# Patient Record
Sex: Female | Born: 1980 | Race: Black or African American | Hispanic: No | Marital: Single | Smoking: Current every day smoker
Health system: Southern US, Community
[De-identification: ages and names within clinical notes are randomized; demographics above are authoritative.]

## PROBLEM LIST (undated history)

## (undated) ENCOUNTER — Inpatient Hospital Stay (HOSPITAL_COMMUNITY): Payer: Self-pay

## (undated) DIAGNOSIS — Z789 Other specified health status: Secondary | ICD-10-CM

## (undated) HISTORY — PX: NO PAST SURGERIES: SHX2092

---

## 2017-11-22 ENCOUNTER — Other Ambulatory Visit: Payer: Self-pay

## 2017-11-22 ENCOUNTER — Ambulatory Visit: Payer: Self-pay | Admitting: Physician Assistant

## 2017-11-22 ENCOUNTER — Encounter: Payer: Self-pay | Admitting: Physician Assistant

## 2017-11-22 VITALS — BP 120/78 | HR 101 | Temp 99.4°F | Resp 18 | Ht 66.0 in | Wt 170.0 lb

## 2017-11-22 DIAGNOSIS — F17210 Nicotine dependence, cigarettes, uncomplicated: Secondary | ICD-10-CM

## 2017-11-22 DIAGNOSIS — Z01818 Encounter for other preprocedural examination: Secondary | ICD-10-CM

## 2017-11-22 DIAGNOSIS — F17200 Nicotine dependence, unspecified, uncomplicated: Secondary | ICD-10-CM

## 2017-11-22 NOTE — Progress Notes (Signed)
Subjective:    Patient ID: Crystal Wiggins, female    DOB: January 04, 1981, 37 y.o.   MRN: [id number]  Chief Complaint  Patient presents with  . Establish Care    Pt states she is here to go over lab work she had done with [facility] through a doctor from [place]. Pt states she needs surgical clearance letter.   Patient presents today for medical clearance.  She is having an abdominoplasty on February 26 by Dr. Raynelle Chary near [place], [place].  She had an EKG performed at an outside facility and non-fasting blood work at [facility].   Recently moved from [place] with her child and fiance in 09/2017. Enjoys [place].  Currently not working due to the move, previously worked as Research scientist (physical sciences).   No significant medical history.  Currently taking Hair Skin and Nails vitamin and MSM. No prescription medication.  Overall, she feels well and healthy.  Working on quitting smoking. Has smoked once in the past 4 days.  Review of Systems  Constitutional: Negative for chills, fatigue, fever and unexpected weight change.  Eyes: Negative for visual disturbance.  Respiratory: Negative for shortness of breath and wheezing.   Cardiovascular: Negative for chest pain, palpitations and leg swelling.  Gastrointestinal: Negative for constipation, diarrhea, nausea and vomiting.  Genitourinary: Negative for difficulty urinating, frequency and urgency.  Musculoskeletal: Negative for neck stiffness.  Neurological: Negative for dizziness, weakness, light-headedness and headaches.   There are no active problems to display for this patient.  Prior to Admission medications   Medication Sig Start Date End Date Taking? Authorizing Provider  Methylsulfonylmethane (MSM) 1000 MG CAPS Take by mouth.   Yes [provider]  Multiple Vitamins-Minerals (HAIR SKIN AND NAILS FORMULA PO) Take by mouth.   Yes [provider]   No Known Allergies    Objective:   Physical Exam  Constitutional: She is  oriented to person, place, and time. She appears well-developed and well-nourished.  HENT:  Head: Normocephalic.  Eyes: Conjunctivae are normal.  Neck: No JVD present.  Cardiovascular: Normal rate, regular rhythm, normal heart sounds and intact distal pulses. Exam reveals no gallop and no friction rub.  No murmur heard. Pulses:      Radial pulses are 2+ on the right side, and 2+ on the left side.       Posterior tibial pulses are 2+ on the right side, and 2+ on the left side.  Pulmonary/Chest: Effort normal and breath sounds normal. No respiratory distress. She has no wheezes. She has no rales. She exhibits no tenderness.  Lymphadenopathy:    She has no cervical adenopathy.  Neurological: She is alert and oriented to person, place, and time.  Skin: Skin is warm and dry.  Psychiatric: She has a normal mood and affect. Her behavior is normal.   Blood pressure 120/78, pulse (!) 101, temperature 99.4 F (37.4 C), temperature source Oral, resp. rate 18, height 5\' 6"  (1.676 m), weight 170 lb (77.1 kg), last menstrual period 10/24/2017, SpO2 99 %.  EKG from outside facility on 11/18/2017 reviewed with Dr. Pamella Pert. NSR, rate 90 bpm. PR 170, QTc 420. No ischemia or LVH noted.  Outside labs drawn. Non-fasting. CBC, Chem 14, PT/INR/aPTT, HIV , A1C (5.6%), HCG, UCx normal except: MCHC 31.4 Glucose 109 Cl 107 CO2 19    Assessment & Plan:  1. Preoperative clearance EKG and lab results reviewed. Results and examination do not reveal any concerns for upcoming surgery. Provided letter to send to the plastic surgeon who  will perform her surgery later this month.  2. Cigarette smoking motivated to quit Continue efforts to quit smoking. Provided education information about the benefits of smoking cessation.   Return for re-evaluation as needed.  Noemi Chapel, PA-S

## 2017-11-22 NOTE — Progress Notes (Signed)
Patient ID: Crystal Wiggins, female     DOB: [birth year], 37 y.o.    MRN: [id number]  PCP: Patient, No Pcp Per  Chief Complaint  Patient presents with  . Establish Care    Pt states she is here to go over lab work she had done with [facility] through a doctor from [place]. Pt states she needs surgical clearance letter.    Subjective:   This patient is new to me and presents for evaluation of fitness for upcoming surgery. She is accompanied by Crystal 37 year old Wiggins.  Moved to [place] from [place] in 09/2018. Abdominoplasty is scheduled for 12/17/2017 with Dr. Raynelle Chary in [place], [place].  She is working on quitting smoking. Has smoked once in the past 4 days.  No other significant PMH/chronic problems. No previous surgeries.  Review of Systems Constitutional: Negative for chills, fatigue, fever and unexpected weight change.  Eyes: Negative for visual disturbance.  Respiratory: Negative for shortness of breath and wheezing.   Cardiovascular: Negative for chest pain, palpitations and leg swelling.  Gastrointestinal: Negative for constipation, diarrhea, nausea and vomiting.  Genitourinary: Negative for difficulty urinating, frequency and urgency.  Musculoskeletal: Negative for neck stiffness.  Neurological: Negative for dizziness, weakness, light-headedness and headaches.    Prior to Admission medications   Medication Sig Start Date End Date Taking? Authorizing Provider  Methylsulfonylmethane (MSM) 1000 MG CAPS Take by mouth.   Yes [provider]  Multiple Vitamins-Minerals (HAIR SKIN AND NAILS FORMULA PO) Take by mouth.   Yes [provider]     No Known Allergies   There are no active problems to display for this patient.    Family History  Problem Relation Age of Onset  . Heart disease Father   . Cancer Maternal Grandmother   . Diabetes Paternal Grandmother      Social History   Socioeconomic History  . Marital status: Significant Other   Spouse name: engaged  . Number of children: 1  . Years of education: Not on file  . Highest education level: Not on file  Social Needs  . Financial resource strain: Not on file  . Food insecurity - worry: Not on file  . Food insecurity - inability: Not on file  . Transportation needs - medical: Not on file  . Transportation needs - non-medical: Not on file  Occupational History  . 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 Smoking status: Current Some Day Smoker    Types: Cigarettes  . Smokeless tobacco: Current User  . Tobacco comment: currently trying to quit (decreasing amount)  Substance and Sexual Activity  . Alcohol use: No    Frequency: Never  . Drug use: No  . Sexual activity: Yes    Birth control/protection: None  Other Topics Concern  . Not on file  Social History Narrative   Moved from [place] with Crystal Wiggins (born 04/28/2012)and fiance 09/2017 to be near Crystal Wiggins.    Likes [place]       Objective:  Physical Exam  Constitutional: She is oriented to person, place, and time. She appears well-developed and well-nourished. She is active and cooperative. No distress.  BP 120/78 (BP Location: Right Arm, Patient Position: Sitting, Cuff Size: Normal)   Pulse (!) 101   Temp 99.4 F (37.4 C) (Oral)   Resp 18   Ht 5\' 6"  (1.676 m)   Wt 170 lb (77.1 kg)   LMP 10/24/2017   SpO2 99%  BMI 27.44 kg/m   HENT:  Head: Normocephalic and atraumatic.  Right Ear: Hearing normal.  Left Ear: Hearing normal.  Eyes: Conjunctivae are normal. No scleral icterus.  Neck: Normal range of motion. Neck supple. No thyromegaly present.  Cardiovascular: Normal rate, regular rhythm and normal heart sounds.  Pulses:      Radial pulses are 2+ on the right side, and 2+ on the left side.  Pulmonary/Chest: Effort normal and breath sounds normal.  Lymphadenopathy:       Head (right side): No tonsillar, no preauricular, no posterior auricular and no  occipital adenopathy present.       Head (left side): No tonsillar, no preauricular, no posterior auricular and no occipital adenopathy present.    She has no cervical adenopathy.       Right: No supraclavicular adenopathy present.       Left: No supraclavicular adenopathy present.  Neurological: She is alert and oriented to person, place, and time. No sensory deficit.  Skin: Skin is warm, dry and intact. No rash noted. No cyanosis or erythema. Nails show no clubbing.  Psychiatric: She has a normal mood and affect. Crystal speech is normal and behavior is normal.    EKG from outside facility on 11/18/2017 reviewed with Dr. Pamella Pert. NSR, rate 90 bpm. PR 170, QTc 420. No ischemia or LVH noted.  Outside labs drawn. Non-fasting. CBC, Chem 14, PT/INR/aPTT, HIV , A1C (5.6%), HCG, UCx normal except: MCHC 31.4 Glucose 109 Cl 107 CO2 19    Assessment & Plan:  1. Preoperative clearance Low risk for surgical procedure. Letter to Dr. Raynelle Chary.  2. Cigarette smoker motivated to quit Encouraged smoking cessation.    Return for re-evaluation as needed.   Fara Chute, PA-C [facility]

## 2017-11-22 NOTE — Patient Instructions (Addendum)
Your EKG and labs and examination do not reveal any concerns for your upcoming surgery.    IF you received an x-ray today, you will receive an invoice from [facility]. Please contact [facility] at [phone] with questions or concerns regarding your invoice.   IF you received labwork today, you will receive an invoice from [facility]. Please contact [facility] at [phone] with questions or concerns regarding your invoice.   Our billing staff will not be able to assist you with questions regarding bills from these companies.  You will be contacted with the lab results as soon as they are available. The fastest way to get your results is to activate your My Chart account. Instructions are located on the last page of this paperwork. If you have not heard from [place] regarding the results in 2 weeks, please contact this office.     Did you know that you begin to benefit from quitting smoking within the first twenty minutes? It's TRUE.  At 20 minutes: -blood pressure decreases -pulse rate drops -body temperature of hands and feet increases  At 8 hours: -carbon monoxide level in blood drops to normal -oxygen level in blood increases to normal  At 24 hours: -the chance of heart attack decreases  At 48 hours: -nerve endings start regrowing -ability to smell and taste is enhanced  2 weeks-3 months: -circulation improves -walking becomes easier -lung function improves  1-9 months: -coughing, sinus congestion, fatigue and shortness of breath decreases  1 year: -excess risk of heart disease is decreased to HALF that of a smoker  5 years: Stroke risk is reduced to that of people who have never smoked  10 years: -risk of lung cancer drops to as little as half that of continuing smokers -risk of cancer of the mouth, throat, esophagus, bladder, kidney and pancreas decreases -risk of ulcer decreases  15 years -risk of heart disease is now similar to that of people  who have never smoked -risk of death returns to nearly the level of people who have never smoked

## 2017-11-23 ENCOUNTER — Encounter: Payer: Self-pay | Admitting: Physician Assistant

## 2017-11-23 DIAGNOSIS — F17210 Nicotine dependence, cigarettes, uncomplicated: Secondary | ICD-10-CM | POA: Insufficient documentation

## 2017-11-23 DIAGNOSIS — F17200 Nicotine dependence, unspecified, uncomplicated: Secondary | ICD-10-CM

## 2018-07-05 ENCOUNTER — Encounter (HOSPITAL_COMMUNITY): Payer: Self-pay

## 2018-07-05 ENCOUNTER — Other Ambulatory Visit: Payer: Self-pay

## 2018-07-05 ENCOUNTER — Emergency Department (HOSPITAL_COMMUNITY)
Admission: EM | Admit: 2018-07-05 | Discharge: 2018-07-05 | Disposition: A | Discharge status: Home / Self Care | Payer: Medicaid Other | Attending: Emergency Medicine | Admitting: Emergency Medicine

## 2018-07-05 DIAGNOSIS — Y9389 Activity, other specified: Secondary | ICD-10-CM | POA: Insufficient documentation

## 2018-07-05 DIAGNOSIS — Y92009 Unspecified place in unspecified non-institutional (private) residence as the place of occurrence of the external cause: Secondary | ICD-10-CM | POA: Insufficient documentation

## 2018-07-05 DIAGNOSIS — S0990XA Unspecified injury of head, initial encounter: Secondary | ICD-10-CM | POA: Diagnosis not present

## 2018-07-05 DIAGNOSIS — Z3A12 12 weeks gestation of pregnancy: Secondary | ICD-10-CM | POA: Diagnosis not present

## 2018-07-05 DIAGNOSIS — F17210 Nicotine dependence, cigarettes, uncomplicated: Secondary | ICD-10-CM | POA: Insufficient documentation

## 2018-07-05 DIAGNOSIS — W228XXA Striking against or struck by other objects, initial encounter: Secondary | ICD-10-CM | POA: Insufficient documentation

## 2018-07-05 DIAGNOSIS — O99331 Smoking (tobacco) complicating pregnancy, first trimester: Secondary | ICD-10-CM | POA: Diagnosis not present

## 2018-07-05 DIAGNOSIS — Y999 Unspecified external cause status: Secondary | ICD-10-CM | POA: Insufficient documentation

## 2018-07-05 MED ORDER — ACETAMINOPHEN 325 MG PO TABS
650.0000 mg | ORAL_TABLET | Freq: Once | ORAL | Status: AC
  Administered 2018-07-05: 650 mg via ORAL
  Filled 2018-07-05: qty 2

## 2018-07-05 NOTE — ED Triage Notes (Signed)
Pt presents to ED via EMS after a can fell on her head. Pt reports that she has had a headache, and it's getting worse. No LOC. Pt is [redacted] weeks pregnant.

## 2018-07-05 NOTE — ED Provider Notes (Signed)
[facility] DEPT Provider Note   CSN: [id number] Arrival date & time: 07/05/18  0113     History   Chief Complaint Chief Complaint  Patient presents with  . Head Injury    HPI Crystal Wiggins is a 37 y.o. female.  Patint presents to ED after a minor head injury earlier in the evening. She opened a cabinet while at home and a large can of food fell from the cabinet hitting her in the right frontal scalp. No LOC. She reports nausea. The accident happened around 1;00 am. There is no wound. She denies neck pain. The patient reports she is [redacted] weeks pregnant. No abdominal injury. No vaginal bleeding or abdominal pain.  The history is provided by the patient. No language interpreter was used.  Head Injury   Pertinent negatives include no vomiting.    History reviewed. No pertinent past medical history.  Patient Active Problem List   Diagnosis Date Noted  . Cigarette smoker motivated to quit 11/23/2017    History reviewed. No pertinent surgical history.   OB History   None      Home Medications    Prior to Admission medications   Medication Sig Start Date End Date Taking? Authorizing Provider  prenatal vitamin w/FE, FA (PRENATAL 1 + 1) 27-1 MG TABS tablet Take 1 tablet by mouth daily at 12 noon.   Yes [provider]    Family History Family History  Problem Relation Age of Onset  . Heart disease Father   . Cancer Maternal Grandmother   . Diabetes Paternal Grandmother     Social History Social History   Tobacco Use  . Smoking status: Current Some Day Smoker    Types: Cigarettes  . Smokeless tobacco: Current User  . Tobacco comment: currently trying to quit (decreasing amount)  Substance Use Topics  . Alcohol use: No    Frequency: Never  . Drug use: No     Allergies   Patient has no known allergies.   Review of Systems Review of Systems  Eyes: Negative for visual disturbance.  Gastrointestinal: Positive for  nausea. Negative for vomiting.  Musculoskeletal: Negative for neck pain.  Neurological: Positive for headaches. Negative for syncope.     Physical Exam Updated Vital Signs BP 127/84 (BP Location: Right Arm)   Pulse 88   Temp 98.2 F (36.8 C) (Oral)   Resp 18   Ht 5\' 6"  (1.676 m)   Wt 80.3 kg   SpO2 98%   BMI 28.57 kg/m   Physical Exam  Constitutional: She is oriented to person, place, and time. She appears well-developed and well-nourished.  HENT:  Head: Normocephalic.  Scalp tenderness over right frontal area. No significant swelling, wound or bleeding.  Eyes: Conjunctivae are normal.  Neck: Normal range of motion. Neck supple.  Cardiovascular: Normal rate and regular rhythm.  Pulmonary/Chest: Effort normal and breath sounds normal. She has no wheezes. She has no rales. She exhibits no tenderness.  Abdominal: Soft. Bowel sounds are normal. There is no tenderness. There is no rebound and no guarding.  Musculoskeletal: Normal range of motion.  No midline or paracervical tenderness.   Neurological: She is alert and oriented to person, place, and time. No sensory deficit. She exhibits normal muscle tone. Coordination normal.  CN's 3-12 grossly intact. Speech is clear and focused. No facial asymmetry. No lateralizing weakness.  No deficits of coordination. Ambulatory without imbalance.    Skin: Skin is warm and dry. No rash noted.  Psychiatric: She has a normal mood and affect.     ED Treatments / Results  Labs (all labs ordered are listed, but only abnormal results are displayed) Labs Reviewed - No data to display  EKG None  Radiology No results found.  Procedures Procedures (including critical care time)  Medications Ordered in ED Medications - No data to display   Initial Impression / Assessment and Plan / ED Course  I have reviewed the triage vital signs and the nursing notes.  Pertinent labs & imaging results that were available during my care of the  patient were reviewed by me and considered in my medical decision making (see chart for details).     Patient here for evaluation of minor head injury without LOC. It has been 6 hours since the injury and she has a nonfocal neurologic exam. No new symptoms since injury. She is felt appropriate for discharge home without evidence for intracranial injury.  Final Clinical Impressions(s) / ED Diagnoses   Final diagnoses:  None   1. Minor head injury  ED Discharge Orders    None       Dennie Bible 07/05/18 Karn Pickler, April, MD 07/05/18 3143

## 2018-08-22 ENCOUNTER — Encounter (HOSPITAL_COMMUNITY): Payer: Self-pay | Admitting: *Deleted

## 2018-08-22 ENCOUNTER — Other Ambulatory Visit: Payer: Self-pay

## 2018-08-22 ENCOUNTER — Inpatient Hospital Stay (HOSPITAL_BASED_OUTPATIENT_CLINIC_OR_DEPARTMENT_OTHER): Payer: Medicaid Other

## 2018-08-22 ENCOUNTER — Inpatient Hospital Stay (HOSPITAL_COMMUNITY)
Admission: AD | Admit: 2018-08-22 | Discharge: 2018-08-22 | Disposition: A | Discharge status: Home / Self Care | Payer: Medicaid Other | Source: Ambulatory Visit | Attending: Obstetrics | Admitting: Obstetrics

## 2018-08-22 DIAGNOSIS — R1030 Lower abdominal pain, unspecified: Secondary | ICD-10-CM | POA: Insufficient documentation

## 2018-08-22 DIAGNOSIS — B9689 Other specified bacterial agents as the cause of diseases classified elsewhere: Secondary | ICD-10-CM | POA: Diagnosis not present

## 2018-08-22 DIAGNOSIS — O3412 Maternal care for benign tumor of corpus uteri, second trimester: Secondary | ICD-10-CM | POA: Diagnosis not present

## 2018-08-22 DIAGNOSIS — O3410 Maternal care for benign tumor of corpus uteri, unspecified trimester: Secondary | ICD-10-CM

## 2018-08-22 DIAGNOSIS — O23592 Infection of other part of genital tract in pregnancy, second trimester: Secondary | ICD-10-CM | POA: Diagnosis not present

## 2018-08-22 DIAGNOSIS — N760 Acute vaginitis: Secondary | ICD-10-CM | POA: Diagnosis not present

## 2018-08-22 DIAGNOSIS — Z3A19 19 weeks gestation of pregnancy: Secondary | ICD-10-CM | POA: Insufficient documentation

## 2018-08-22 DIAGNOSIS — R109 Unspecified abdominal pain: Secondary | ICD-10-CM

## 2018-08-22 DIAGNOSIS — D259 Leiomyoma of uterus, unspecified: Secondary | ICD-10-CM | POA: Insufficient documentation

## 2018-08-22 DIAGNOSIS — O26899 Other specified pregnancy related conditions, unspecified trimester: Secondary | ICD-10-CM

## 2018-08-22 DIAGNOSIS — O99332 Smoking (tobacco) complicating pregnancy, second trimester: Secondary | ICD-10-CM | POA: Insufficient documentation

## 2018-08-22 DIAGNOSIS — F17210 Nicotine dependence, cigarettes, uncomplicated: Secondary | ICD-10-CM | POA: Insufficient documentation

## 2018-08-22 DIAGNOSIS — O9989 Other specified diseases and conditions complicating pregnancy, childbirth and the puerperium: Secondary | ICD-10-CM | POA: Diagnosis not present

## 2018-08-22 DIAGNOSIS — O26892 Other specified pregnancy related conditions, second trimester: Secondary | ICD-10-CM | POA: Diagnosis not present

## 2018-08-22 HISTORY — DX: Other specified health status: Z78.9

## 2018-08-22 LAB — URINALYSIS, ROUTINE W REFLEX MICROSCOPIC
BILIRUBIN URINE: NEGATIVE
Glucose, UA: NEGATIVE mg/dL
HGB URINE DIPSTICK: NEGATIVE
Ketones, ur: 5 mg/dL — AB
NITRITE: NEGATIVE
PH: 6 (ref 5.0–8.0)
Protein, ur: NEGATIVE mg/dL
SPECIFIC GRAVITY, URINE: 1.025 (ref 1.005–1.030)

## 2018-08-22 LAB — WET PREP, GENITAL
SPERM: NONE SEEN
Trich, Wet Prep: NONE SEEN
Yeast Wet Prep HPF POC: NONE SEEN

## 2018-08-22 MED ORDER — METRONIDAZOLE 500 MG PO TABS: 500.0000 mg | ORAL_TABLET | Freq: Two times a day (BID) | ORAL | 0 refills | Status: DC

## 2018-08-22 NOTE — Progress Notes (Signed)
Vaginal cultures obtained by C. Maryruth Hancock, CNM

## 2018-08-22 NOTE — MAU Provider Note (Signed)
History     CSN: [id number]  Arrival date and time: 08/22/18 1352   First Provider Initiated Contact with Patient 08/22/18 1453      Chief Complaint  Patient presents with  . Abdominal Pain   HPI Crystal Wiggins is a 37 y.o. G4P2102 at [redacted]w[redacted]d who presents with lower abdominal cramping since 0930 this morning. She rates the pain a 6/10 and has not tried anything for the pain. She denies any leaking or bleeding. Reports feeling fetal movement. No recent intercourse. She has not had an ultrasound this pregnancy. Denies any complications in this pregnancy but reports a history of a 20 week still birth.  OB History    Gravida  4   Para  3   Term  2   Preterm  1   AB  0   Living  2     SAB  0   TAB  0   Ectopic  0   Multiple  0   Live Births  2           Past Medical History:  Diagnosis Date  . Medical history non-contributory     Past Surgical History:  Procedure Laterality Date  . NO PAST SURGERIES      Family History  Problem Relation Age of Onset  . Heart disease Father   . Cancer Maternal Grandmother   . Diabetes Paternal Grandmother     Social History   Tobacco Use  . Smoking status: Current Some Day Smoker    Types: Cigarettes  . Smokeless tobacco: Current User  . Tobacco comment: currently trying to quit (decreasing amount)  Substance Use Topics  . Alcohol use: No    Frequency: Never  . Drug use: No    Allergies: No Known Allergies  Medications Prior to Admission  Medication Sig Dispense Refill Last Dose  . prenatal vitamin w/FE, FA (PRENATAL 1 + 1) 27-1 MG TABS tablet Take 1 tablet by mouth daily at 12 noon.   07/04/2018 at Unknown time    Review of Systems  Constitutional: Negative.  Negative for fatigue and fever.  HENT: Negative.   Respiratory: Negative.  Negative for shortness of breath.   Cardiovascular: Negative.  Negative for chest pain.  Gastrointestinal: Positive for abdominal pain. Negative for constipation, diarrhea,  nausea and vomiting.  Genitourinary: Negative.  Negative for dysuria.  Neurological: Negative.  Negative for dizziness and headaches.   Physical Exam   Blood pressure 121/62, pulse 95, temperature 98.8 F (37.1 C), temperature source Oral, resp. rate 16, weight 85.4 kg, last menstrual period 10/24/2017, SpO2 100 %.  Physical Exam  Nursing note and vitals reviewed. Constitutional: She is oriented to person, place, and time. She appears well-developed and well-nourished. No distress.  HENT:  Head: Normocephalic.  Eyes: Pupils are equal, round, and reactive to light.  Cardiovascular: Normal rate, regular rhythm and normal heart sounds.  Respiratory: Effort normal and breath sounds normal. No respiratory distress.  GI: Soft. Bowel sounds are normal. She exhibits no distension and no mass. There is no tenderness. There is no rebound and no guarding.  Neurological: She is alert and oriented to person, place, and time.  Skin: Skin is warm and dry.  Psychiatric: She has a normal mood and affect. Her behavior is normal. Judgment and thought content normal.   Dilation: Closed Effacement (%): Thick Exam by:: Haynes Bast, CNM  FHT: 150 bpm  MAU Course  Procedures Results for orders placed or performed during the hospital encounter  of 08/22/18 (from the past 24 hour(s))  Urinalysis, Routine w reflex microscopic     Status: Abnormal   Collection Time: 08/22/18  2:26 PM  Result Value Ref Range   Color, Urine YELLOW YELLOW   APPearance CLOUDY (A) CLEAR   Specific Gravity, Urine 1.025 1.005 - 1.030   pH 6.0 5.0 - 8.0   Glucose, UA NEGATIVE NEGATIVE mg/dL   Hgb urine dipstick NEGATIVE NEGATIVE   Bilirubin Urine NEGATIVE NEGATIVE   Ketones, ur 5 (A) NEGATIVE mg/dL   Protein, ur NEGATIVE NEGATIVE mg/dL   Nitrite NEGATIVE NEGATIVE   Leukocytes, UA TRACE (A) NEGATIVE   RBC / HPF 0-5 0 - 5 RBC/hpf   WBC, UA 0-5 0 - 5 WBC/hpf   Bacteria, UA RARE (A) NONE SEEN   Squamous Epithelial / LPF 11-20 0  - 5   Mucus PRESENT   Wet prep, genital     Status: Abnormal   Collection Time: 08/22/18  4:17 PM  Result Value Ref Range   Yeast Wet Prep HPF POC NONE SEEN NONE SEEN   Trich, Wet Prep NONE SEEN NONE SEEN   Clue Cells Wet Prep HPF POC PRESENT (A) NONE SEEN   WBC, Wet Prep HPF POC FEW (A) NONE SEEN   Sperm NONE SEEN    MDM UA Wet prep and gc/chlamydia Patient declined pain medication in MAU [place] MFM OB Limited- cervical length 4.43 cm, normal AFI, posterior placenta, multiple uterine fibroids noted with the largest being 4cm  Assessment and Plan   1. Abdominal pain affecting pregnancy   2. Bacterial vaginosis   3. Uterine fibroid during pregnancy, antepartum   4. [redacted] weeks gestation of pregnancy    -Discharge home in stable condition -Rx for metronidazole sent to patient's pharmacy -Preterm labor precautions discussed -Patient advised to follow-up with [facility] as scheduled on 11/8 for prenatal care -Patient may return to MAU as needed or if her condition were to change or worsen  Wende Mott CNM 08/22/2018, 2:53 PM

## 2018-08-22 NOTE — MAU Note (Signed)
Having abd cramping and pressure.  Started at 0930 this morning.

## 2018-08-22 NOTE — Discharge Instructions (Signed)

## 2018-08-25 LAB — GC/CHLAMYDIA PROBE AMP (~~LOC~~) NOT AT ARMC
CHLAMYDIA, DNA PROBE: NEGATIVE
Neisseria Gonorrhea: NEGATIVE

## 2018-10-22 NOTE — L&D Delivery Note (Signed)
Delivery Note Patient pushed for < 10 minutes.  At 2:12 PM a viable female was delivered via Vaginal, Spontaneous.  Presentation: vertex; Position: Right,, Occiput,, Anterior;   During delivery of the fetal head, it rotated > 180 degrees to ROA.  After delivery of the fetal head, the shoulders did not easily deliver.  The patient was instructed to stop pushing.  McRoberts and suprapubic pressure were employed.  The posterior (right) arm was unable to be delivered.  The woods screw maneuver was then successful in rotating the anterior (left) shoulder such that the shoulder and body was able to be delivered.   The cord was cut and clamped and the baby handed off to the team for evaluation.  NICU was called at identification of the shoulder dystocia.  Following delivery, baby was moving both upper extremities equally and well.  Total time for dystocia was 90 sec.   Delivery of the head: 01/11/2019  2:11 PM First maneuver: 01/11/2019  2:11 PM, McRoberts Suprapubic Pressure Second maneuver: , Woods screw (fetal shoulder rotation)   APGAR: 3 , 9; weight pending .   Arterial cord gas pH 7.28.   The placenta delivered spontaneously and without difficulty.  However, after delivery, it was noted that most of the membranes had not delivered with the placenta.  A uterine sweep was performed and the remainder of membranes removed manually.  Will plan one dose of ancef.   Anesthesia:  Epidural Episiotomy:  None Lacerations:  None Suture Repair: n/a Est. Blood Loss (mL): 180 mL    Mom to postpartum.  Baby to Couplet care / Skin to Skin.  Crystal Wiggins 01/11/2019, 2:41 PM

## 2019-01-05 ENCOUNTER — Other Ambulatory Visit: Payer: Self-pay | Admitting: Obstetrics

## 2019-01-05 ENCOUNTER — Telehealth (HOSPITAL_COMMUNITY): Payer: Self-pay | Admitting: *Deleted

## 2019-01-09 ENCOUNTER — Other Ambulatory Visit (HOSPITAL_COMMUNITY): Payer: Self-pay | Admitting: *Deleted

## 2019-01-11 ENCOUNTER — Inpatient Hospital Stay (HOSPITAL_COMMUNITY): Payer: Medicaid Other | Admitting: Anesthesiology

## 2019-01-11 ENCOUNTER — Inpatient Hospital Stay (HOSPITAL_COMMUNITY)
Admission: EM | Admit: 2019-01-11 | Discharge: 2019-01-12 | DRG: 807 | Disposition: A | Discharge status: Home / Self Care | Payer: Medicaid Other | Attending: Obstetrics | Admitting: Obstetrics

## 2019-01-11 ENCOUNTER — Encounter (HOSPITAL_COMMUNITY): Payer: Self-pay

## 2019-01-11 ENCOUNTER — Other Ambulatory Visit: Payer: Self-pay

## 2019-01-11 ENCOUNTER — Inpatient Hospital Stay (HOSPITAL_COMMUNITY): Admission: AD | Admit: 2019-01-11 | Payer: Medicaid Other | Source: Home / Self Care | Admitting: Obstetrics

## 2019-01-11 ENCOUNTER — Inpatient Hospital Stay (HOSPITAL_COMMUNITY): Payer: Medicaid Other

## 2019-01-11 DIAGNOSIS — O09523 Supervision of elderly multigravida, third trimester: Secondary | ICD-10-CM | POA: Diagnosis present

## 2019-01-11 DIAGNOSIS — O26893 Other specified pregnancy related conditions, third trimester: Secondary | ICD-10-CM | POA: Diagnosis present

## 2019-01-11 DIAGNOSIS — O99824 Streptococcus B carrier state complicating childbirth: Principal | ICD-10-CM | POA: Diagnosis present

## 2019-01-11 DIAGNOSIS — Z87891 Personal history of nicotine dependence: Secondary | ICD-10-CM

## 2019-01-11 DIAGNOSIS — Z3A39 39 weeks gestation of pregnancy: Secondary | ICD-10-CM | POA: Diagnosis not present

## 2019-01-11 LAB — CBC
HCT: 36.4 % (ref 36.0–46.0)
HEMOGLOBIN: 12 g/dL (ref 12.0–15.0)
MCH: 29.4 pg (ref 26.0–34.0)
MCHC: 33 g/dL (ref 30.0–36.0)
MCV: 89.2 fL (ref 80.0–100.0)
Platelets: 199 10*3/uL (ref 150–400)
RBC: 4.08 MIL/uL (ref 3.87–5.11)
RDW: 13.6 % (ref 11.5–15.5)
WBC: 11.3 10*3/uL — ABNORMAL HIGH (ref 4.0–10.5)
nRBC: 0 % (ref 0.0–0.2)

## 2019-01-11 LAB — TYPE AND SCREEN
ABO/RH(D): B POS
Antibody Screen: NEGATIVE

## 2019-01-11 LAB — RPR: RPR Ser Ql: NONREACTIVE

## 2019-01-11 LAB — ABO/RH: ABO/RH(D): B POS

## 2019-01-11 MED ORDER — OXYCODONE-ACETAMINOPHEN 5-325 MG PO TABS: 2.0000 | ORAL_TABLET | ORAL | Status: DC | PRN

## 2019-01-11 MED ORDER — IBUPROFEN 600 MG PO TABS
600.0000 mg | ORAL_TABLET | Freq: Four times a day (QID) | ORAL | Status: DC
  Administered 2019-01-11 (×2): 600 mg via ORAL
  Filled 2019-01-11 (×2): qty 1

## 2019-01-11 MED ORDER — DIPHENHYDRAMINE HCL 50 MG/ML IJ SOLN: 12.5000 mg | INTRAMUSCULAR | Status: DC | PRN

## 2019-01-11 MED ORDER — LACTATED RINGERS IV SOLN
INTRAVENOUS | Status: DC
  Administered 2019-01-11 (×2): via INTRAVENOUS

## 2019-01-11 MED ORDER — PRENATAL MULTIVITAMIN CH: 1.0000 | ORAL_TABLET | Freq: Every day | ORAL | Status: DC

## 2019-01-11 MED ORDER — ONDANSETRON HCL 4 MG PO TABS: 4.0000 mg | ORAL_TABLET | ORAL | Status: DC | PRN

## 2019-01-11 MED ORDER — OXYCODONE-ACETAMINOPHEN 5-325 MG PO TABS: 1.0000 | ORAL_TABLET | ORAL | Status: DC | PRN

## 2019-01-11 MED ORDER — EPHEDRINE 5 MG/ML INJ: 10.0000 mg | INTRAVENOUS | Status: DC | PRN

## 2019-01-11 MED ORDER — BENZOCAINE-MENTHOL 20-0.5 % EX AERO: 1.0000 "application " | INHALATION_SPRAY | CUTANEOUS | Status: DC | PRN

## 2019-01-11 MED ORDER — TETANUS-DIPHTH-ACELL PERTUSSIS 5-2.5-18.5 LF-MCG/0.5 IM SUSP: 0.5000 mL | Freq: Once | INTRAMUSCULAR | Status: DC

## 2019-01-11 MED ORDER — SODIUM CHLORIDE (PF) 0.9 % IJ SOLN
INTRAMUSCULAR | Status: DC | PRN
  Administered 2019-01-11: 12 mL/h via EPIDURAL

## 2019-01-11 MED ORDER — PHENYLEPHRINE 40 MCG/ML (10ML) SYRINGE FOR IV PUSH (FOR BLOOD PRESSURE SUPPORT)
80.0000 ug | PREFILLED_SYRINGE | INTRAVENOUS | Status: DC | PRN
  Filled 2019-01-11: qty 10

## 2019-01-11 MED ORDER — ACETAMINOPHEN 325 MG PO TABS: 650.0000 mg | ORAL_TABLET | ORAL | Status: DC | PRN

## 2019-01-11 MED ORDER — FENTANYL-BUPIVACAINE-NACL 0.5-0.125-0.9 MG/250ML-% EP SOLN
12.0000 mL/h | EPIDURAL | Status: DC | PRN
  Filled 2019-01-11: qty 250

## 2019-01-11 MED ORDER — DIBUCAINE 1 % RE OINT: 1.0000 "application " | TOPICAL_OINTMENT | RECTAL | Status: DC | PRN

## 2019-01-11 MED ORDER — SODIUM CHLORIDE 0.9 % IV SOLN
5.0000 10*6.[IU] | Freq: Once | INTRAVENOUS | Status: AC
  Administered 2019-01-11: 5 10*6.[IU] via INTRAVENOUS
  Filled 2019-01-11: qty 5

## 2019-01-11 MED ORDER — OXYCODONE HCL 5 MG PO TABS: 10.0000 mg | ORAL_TABLET | ORAL | Status: DC | PRN

## 2019-01-11 MED ORDER — SENNOSIDES-DOCUSATE SODIUM 8.6-50 MG PO TABS
2.0000 | ORAL_TABLET | ORAL | Status: DC
  Administered 2019-01-11: 2 via ORAL
  Filled 2019-01-11: qty 2

## 2019-01-11 MED ORDER — ONDANSETRON HCL 4 MG/2ML IJ SOLN: 4.0000 mg | INTRAMUSCULAR | Status: DC | PRN

## 2019-01-11 MED ORDER — LACTATED RINGERS IV SOLN
500.0000 mL | INTRAVENOUS | Status: DC | PRN
  Administered 2019-01-11: 500 mL via INTRAVENOUS

## 2019-01-11 MED ORDER — SOD CITRATE-CITRIC ACID 500-334 MG/5ML PO SOLN: 30.0000 mL | ORAL | Status: DC | PRN

## 2019-01-11 MED ORDER — ACETAMINOPHEN 325 MG PO TABS
650.0000 mg | ORAL_TABLET | ORAL | Status: DC | PRN
  Administered 2019-01-11: 650 mg via ORAL
  Filled 2019-01-11: qty 2

## 2019-01-11 MED ORDER — LIDOCAINE HCL (PF) 1 % IJ SOLN
INTRAMUSCULAR | Status: DC | PRN
  Administered 2019-01-11: 6 mL via EPIDURAL

## 2019-01-11 MED ORDER — ONDANSETRON HCL 4 MG/2ML IJ SOLN: 4.0000 mg | Freq: Four times a day (QID) | INTRAMUSCULAR | Status: DC | PRN

## 2019-01-11 MED ORDER — DIPHENHYDRAMINE HCL 25 MG PO CAPS: 25.0000 mg | ORAL_CAPSULE | Freq: Four times a day (QID) | ORAL | Status: DC | PRN

## 2019-01-11 MED ORDER — COCONUT OIL OIL
1.0000 "application " | TOPICAL_OIL | Status: DC | PRN
  Administered 2019-01-12: 1 via TOPICAL

## 2019-01-11 MED ORDER — LACTATED RINGERS IV SOLN
500.0000 mL | Freq: Once | INTRAVENOUS | Status: AC
  Administered 2019-01-11: 500 mL via INTRAVENOUS

## 2019-01-11 MED ORDER — LIDOCAINE HCL (PF) 1 % IJ SOLN: 30.0000 mL | INTRAMUSCULAR | Status: DC | PRN

## 2019-01-11 MED ORDER — OXYTOCIN 40 UNITS IN NORMAL SALINE INFUSION - SIMPLE MED: 2.5000 [IU]/h | INTRAVENOUS | Status: DC

## 2019-01-11 MED ORDER — CEFAZOLIN SODIUM-DEXTROSE 2-4 GM/100ML-% IV SOLN
2.0000 g | Freq: Three times a day (TID) | INTRAVENOUS | Status: DC
  Administered 2019-01-11: 2 g via INTRAVENOUS
  Filled 2019-01-11 (×2): qty 100

## 2019-01-11 MED ORDER — LACTATED RINGERS AMNIOINFUSION
INTRAVENOUS | Status: DC
  Administered 2019-01-11: 11:00:00 via INTRAUTERINE

## 2019-01-11 MED ORDER — PENICILLIN G 3 MILLION UNITS IVPB - SIMPLE MED
3.0000 10*6.[IU] | INTRAVENOUS | Status: DC
  Administered 2019-01-11 (×2): 3 10*6.[IU] via INTRAVENOUS
  Filled 2019-01-11 (×2): qty 100

## 2019-01-11 MED ORDER — WITCH HAZEL-GLYCERIN EX PADS: 1.0000 "application " | MEDICATED_PAD | CUTANEOUS | Status: DC | PRN

## 2019-01-11 MED ORDER — FENTANYL CITRATE (PF) 100 MCG/2ML IJ SOLN
50.0000 ug | INTRAMUSCULAR | Status: DC | PRN
  Administered 2019-01-11: 100 ug via INTRAVENOUS
  Filled 2019-01-11: qty 2

## 2019-01-11 MED ORDER — PHENYLEPHRINE 40 MCG/ML (10ML) SYRINGE FOR IV PUSH (FOR BLOOD PRESSURE SUPPORT): 80.0000 ug | PREFILLED_SYRINGE | INTRAVENOUS | Status: DC | PRN

## 2019-01-11 MED ORDER — OXYTOCIN BOLUS FROM INFUSION: 500.0000 mL | Freq: Once | INTRAVENOUS | Status: DC

## 2019-01-11 MED ORDER — FAMOTIDINE 20 MG PO TABS
20.0000 mg | ORAL_TABLET | Freq: Two times a day (BID) | ORAL | Status: DC | PRN
  Administered 2019-01-11: 20 mg via ORAL
  Filled 2019-01-11: qty 1

## 2019-01-11 MED ORDER — SIMETHICONE 80 MG PO CHEW: 80.0000 mg | CHEWABLE_TABLET | ORAL | Status: DC | PRN

## 2019-01-11 MED ORDER — OXYCODONE HCL 5 MG PO TABS: 5.0000 mg | ORAL_TABLET | ORAL | Status: DC | PRN

## 2019-01-11 MED ORDER — MISOPROSTOL 25 MCG QUARTER TABLET: 25.0000 ug | ORAL_TABLET | ORAL | Status: DC | PRN

## 2019-01-11 MED ORDER — OXYTOCIN 40 UNITS IN NORMAL SALINE INFUSION - SIMPLE MED
1.0000 m[IU]/min | INTRAVENOUS | Status: DC
  Administered 2019-01-11: 2 m[IU]/min via INTRAVENOUS
  Filled 2019-01-11: qty 1000

## 2019-01-11 MED ORDER — TERBUTALINE SULFATE 1 MG/ML IJ SOLN: 0.2500 mg | Freq: Once | INTRAMUSCULAR | Status: DC | PRN

## 2019-01-11 MED ORDER — FENTANYL-BUPIVACAINE-NACL 0.5-0.125-0.9 MG/250ML-% EP SOLN: 12.0000 mL/h | EPIDURAL | Status: DC | PRN

## 2019-01-11 NOTE — Plan of Care (Signed)
  Problem: Education: Goal: Ability to state and carry out methods to decrease the pain will improve Outcome: Progressing

## 2019-01-11 NOTE — Anesthesia Preprocedure Evaluation (Signed)
Anesthesia Evaluation  Patient identified by MRN, date of birth, ID band Patient awake    Reviewed: Allergy & Precautions, H&P , NPO status , Patient's Chart, lab work & pertinent test results, reviewed documented beta blocker date and time   Airway Mallampati: II  TM Distance: >3 FB Neck ROM: full    Dental no notable dental hx.    Pulmonary neg pulmonary ROS, former smoker,    Pulmonary exam normal breath sounds clear to auscultation       Cardiovascular negative cardio ROS Normal cardiovascular exam Rhythm:regular Rate:Normal     Neuro/Psych negative neurological ROS  negative psych ROS   GI/Hepatic negative GI ROS, Neg liver ROS,   Endo/Other  negative endocrine ROS  Renal/GU negative Renal ROS  negative genitourinary   Musculoskeletal   Abdominal   Peds  Hematology negative hematology ROS (+)   Anesthesia Other Findings   Reproductive/Obstetrics (+) Pregnancy                             Anesthesia Physical Anesthesia Plan  ASA: II  Anesthesia Plan: Epidural   Post-op Pain Management:    Induction:   PONV Risk Score and Plan: 2 and Treatment may vary due to age or medical condition and Ondansetron  Airway Management Planned:   Additional Equipment:   Intra-op Plan:   Post-operative Plan:   Informed Consent: I have reviewed the patients History and Physical, chart, labs and discussed the procedure including the risks, benefits and alternatives for the proposed anesthesia with the patient or authorized representative who has indicated his/her understanding and acceptance.     Dental Advisory Given  Plan Discussed with: CRNA, Anesthesiologist and Surgeon  Anesthesia Plan Comments: (Labs checked- platelets confirmed with RN in room. Fetal heart tracing, per RN, reported to be stable enough for sitting procedure. Discussed epidural, and patient consents to the procedure:   included risk of possible headache,backache, failed block, allergic reaction, and nerve injury. This patient was asked if she had any questions or concerns before the procedure started.)        Anesthesia Quick Evaluation

## 2019-01-11 NOTE — Anesthesia Postprocedure Evaluation (Signed)
Anesthesia Post Note  Patient: Crystal Wiggins  Procedure(s) Performed: AN AD [place]     Patient location during evaluation: Mother Baby Anesthesia Type: Epidural Level of consciousness: awake and alert Pain management: pain level controlled Vital Signs Assessment: post-procedure vital signs reviewed and stable Respiratory status: spontaneous breathing, nonlabored ventilation and respiratory function stable Cardiovascular status: stable Postop Assessment: no headache, no backache and epidural receding Anesthetic complications: no    Last Vitals:  Vitals:   01/11/19 1515 01/11/19 1530  BP: 112/69 (!) 121/105  Pulse: (!) 107 (!) 230  Resp:    Temp:    SpO2:      Last Pain:  Vitals:   01/11/19 1301  TempSrc:   PainSc: 0-No pain   Pain Goal:                   Kallan Merrick

## 2019-01-11 NOTE — H&P (Signed)
38 y.o. [id number] @ [redacted]w[redacted]d presents with contractions.  She was found to not yet be in labor, but is scheduled for an IOL tonight, so she was kept to start her induction.  Otherwise has good fetal movement and no bleeding.  Her pregnancy has been uncomplicated.  She has been on a baby aspirin as she is moderate risk for preeclampsia  Past Medical History:  Diagnosis Date  . Medical history non-contributory     Past Surgical History:  Procedure Laterality Date  . NO PAST SURGERIES      OB History  Gravida Para Term Preterm AB Living  4 3 2 1  0 2  SAB TAB Ectopic Multiple Live Births  0 0 0 0 2    # Outcome Date GA Lbr Len/2nd Weight Sex Delivery Anes PTL Lv  4 Current           3 Term 04/28/14    F Vag-Spont   LIV  2 Term 01/14/09    M Vag-Spont   LIV  1 Preterm 02/06/08    U    FD     Complications: Chromosomal anomalies    Social History   Socioeconomic History  . Marital status: Single    Spouse name: engaged  . Number of children: 1  . Years of education: Not on file  . Highest education level: Not on file  Occupational History  . Occupation: homemaker    Comment: former Research scientist (physical sciences)  Social Needs  . Financial resource strain: Not on file  . Food insecurity:    Worry: Not on file    Inability: Not on file  . Transportation needs:    Medical: Not on file    Non-medical: Not on file  Tobacco Use  . Smoking status: Former Smoker    Types: Cigarettes    Last attempt to quit: 06/12/2018    Years since quitting: 0.5  . Smokeless tobacco: Never Used  Substance and Sexual Activity  . Alcohol use: No    Frequency: Never  . Drug use: No  . Sexual activity: Yes    Birth control/protection: None  Social History Narrative   Moved from [place] with her daughter (born 04/28/2014)and fiance 09/2017 to be near her mother.    [facility]   Patient has no known allergies.    Prenatal Transfer Tool  Maternal Diabetes: No Genetic Screening: Normal low risk  NIPS Maternal Ultrasounds/Referrals: Normal Fetal Ultrasounds or other Referrals:  None Maternal Substance Abuse:  No Significant Maternal Medications:  Meds include: Other:  aspirin 81mg  Significant Maternal Lab Results: Lab values include: Group B Strep positive  ABO, Rh: --/--/B POS (03/22 0146) Antibody: NEG (03/22 0146) Rubella:  Immune RPR:   NR HBsAg:   Neg HIV:   Neg GBS:   Positive by urine        Vitals:   01/11/19 0701 01/11/19 0730  BP: 122/74 (!) 130/93  Pulse: 95 (!) 101  Resp: 18 18  Temp:  98.3 F (36.8 C)  SpO2:       General:  NAD Abdomen:  soft, gravid, EFW 8.5# Ex:  trace edema SVE:  2/40/-3 FHTs:  120s, moderate variability Toco:  q3-4 minutes  2/28--growth [place] with EFW 6lb 5oz (45%)  A/P   38 y.o. [id number] [redacted]w[redacted]d presents with early labor Admit to L&D Augmentation with pitocin Epidural now then AROM  FSR/ vtx/ GBS positive--pcn  [place]

## 2019-01-11 NOTE — Anesthesia Procedure Notes (Signed)
Epidural Patient location during procedure: OB Start time: 01/11/2019 8:53 AM End time: 01/11/2019 9:00 AM  Staffing Anesthesiologist: Janeece Riggers, MD  Preanesthetic Checklist Completed: patient identified, site marked, surgical consent, pre-op evaluation, timeout performed, IV checked, risks and benefits discussed and monitors and equipment checked  Epidural Patient position: sitting Prep: site prepped and draped and DuraPrep Patient monitoring: continuous pulse ox and blood pressure Approach: midline Location: L4-L5 Injection technique: LOR air  Needle:  Needle type: Tuohy  Needle gauge: 17 G Needle length: 9 cm and 9 Needle insertion depth: 8 cm Catheter type: closed end flexible Catheter size: 19 Gauge Catheter at skin depth: 13 cm Test dose: negative  Assessment Events: blood not aspirated, injection not painful, no injection resistance, negative IV test and no paresthesia

## 2019-01-12 LAB — CBC
HEMATOCRIT: 33.9 % — AB (ref 36.0–46.0)
Hemoglobin: 10.8 g/dL — ABNORMAL LOW (ref 12.0–15.0)
MCH: 29.1 pg (ref 26.0–34.0)
MCHC: 31.9 g/dL (ref 30.0–36.0)
MCV: 91.4 fL (ref 80.0–100.0)
Platelets: 196 10*3/uL (ref 150–400)
RBC: 3.71 MIL/uL — ABNORMAL LOW (ref 3.87–5.11)
RDW: 13.8 % (ref 11.5–15.5)
WBC: 14.9 10*3/uL — ABNORMAL HIGH (ref 4.0–10.5)
nRBC: 0 % (ref 0.0–0.2)

## 2019-01-12 MED ORDER — COMPLETENATE 29-1 MG PO CHEW: 1.0000 | CHEWABLE_TABLET | Freq: Every day | ORAL | Status: DC

## 2019-01-12 MED ORDER — SENNOSIDES 8.8 MG/5ML PO SYRP
10.0000 mL | ORAL_SOLUTION | ORAL | Status: DC
  Filled 2019-01-12: qty 10

## 2019-01-12 MED ORDER — ACETAMINOPHEN 160 MG/5ML PO SOLN: 650.0000 mg | ORAL | Status: DC | PRN

## 2019-01-12 MED ORDER — IBUPROFEN 100 MG/5ML PO SUSP
600.0000 mg | Freq: Four times a day (QID) | ORAL | Status: DC
  Administered 2019-01-12 (×2): 600 mg via ORAL
  Filled 2019-01-12 (×2): qty 30

## 2019-01-12 MED ORDER — DOCUSATE SODIUM 50 MG/5ML PO LIQD: 100.0000 mg | ORAL | Status: DC

## 2019-01-12 NOTE — Discharge Summary (Signed)
Obstetric Discharge Summary Reason for Admission: induction of labor Prenatal Procedures: ultrasound Intrapartum Procedures: spontaneous vaginal delivery Postpartum Procedures: none Complications-Operative and Postpartum: shoulder dystocia Hemoglobin  Date Value Ref Range Status  01/12/2019 10.8 (L) 12.0 - 15.0 g/dL Final   HCT  Date Value Ref Range Status  01/12/2019 33.9 (L) 36.0 - 46.0 % Final    Physical Exam:  General: alert and cooperative Lochia: appropriate   Discharge Diagnoses: Term Pregnancy-delivered and shoulder dystocia  Discharge Information: Date: 01/12/2019 Activity: pelvic rest Diet: routine Medications: PNV and Ibuprofen Condition: stable Instructions: refer to practice specific booklet Discharge to: home Follow-up Information    Jerelyn Charles, MD. Schedule an appointment as soon as possible for a visit in 1 month(s).   Specialty:  Obstetrics Contact information: [address] [place] [address] [phone]           Newborn Data: Live born female  Birth Weight: 8 lb 0.6 oz (3645 g) APGAR: 3, 9  Newborn Delivery   Time head delivered:  01/11/2019 14:11:00 Birth date/time:  01/11/2019 14:12:00 Delivery type:  Vaginal, Spontaneous     Home with mother.  Crystal Wiggins 01/12/2019, 4:33 PM

## 2019-01-12 NOTE — Progress Notes (Addendum)
   01/11/19 1657 01/11/19 1822 01/11/19 2258  Vital Signs  Temp 100.3 F (37.9 C) 99.5 F (37.5 C) 99.1 F (37.3 C)  Temp Source Oral Oral Oral   Patient with recent increased oral temperatures. Patient states she feels fine. Tylenol given and advised to increase fluid intake. Will recheck.

## 2019-01-12 NOTE — Lactation Note (Signed)
This note was copied from a baby's chart. Lactation Consultation Note  Patient Name: Crystal Wiggins BWLSL'H Date: 01/12/2019 Reason for consult: Initial assessment;Term P3, 10 hour female infant. This is mom's 4 time putting infant to breast since delivery. Mom breastfeed her other two children for 2 months. Mom receives [facility] in [place]. Mom has DEBP at home. Infant had 2 voids and LC changed a void diaper while in room. LC help mom with hand expression and Mom expressed 2 ml of colostrum that was spoon feed to infant.  LC notice abrasions on mom's right nipple and mom complains of painful latches. Nurse gave mom coconut oil. Mom latched infant on left breast using cross cradle hold, LC asked mom to wait until infant mouth is wide with tongue and chin extend downward, nose touching breast. LC observed infant swallows and infant was still breastfeeding ( 15 minutes) when LC left the room. Per mom," I do not feel pain" , "my breast isn't hurting  while he is feeding " . LC explained breastfeeding should not be painful. Mom plans to breastfeed more any maybe wait to give infant  formula so that she can establish her milk supply. Mom knows to breastfeed according hunger cues, 8 or more times within 24 hours. Mom knows to call Nurse or LC if she has any questions, concerns or need latch assistance. LC discussed I & O. Mom made aware of O/P services, breastfeeding support groups, community resources, and our phone # for post-discharge questions.   Maternal Data Formula Feeding for Exclusion: Yes Reason for exclusion: Mother's choice to formula and breast feed on admission Has patient been taught Hand Expression?: Yes(Infant was given 2 ml of colostrum by spoon.) Does the patient have breastfeeding experience prior to this delivery?: Yes  Feeding Feeding Type: Breast Fed Nipple Type: Slow - flow  LATCH Score Latch: Grasps breast easily, tongue down, lips flanged, rhythmical  sucking.  Audible Swallowing: A few with stimulation  Type of Nipple: Everted at rest and after stimulation  Comfort (Breast/Nipple): Filling, red/small blisters or bruises, mild/mod discomfort(right breast only)  Hold (Positioning): Assistance needed to correctly position infant at breast and maintain latch.  LATCH Score: 7  Interventions Interventions: Breast feeding basics reviewed;Assisted with latch;Skin to skin;Breast massage;Adjust position;Hand express;Support pillows;Expressed milk;Position options  Lactation Tools Discussed/Used Tools: Coconut oil WIC Program: Yes   Consult Status Consult Status: Follow-up Date: 01/12/19 Follow-up type: In-patient    Vicente Serene 01/12/2019, 1:04 AM

## 2019-01-12 NOTE — Progress Notes (Deleted)
Patient with recent increased oral temperatures. Patient states she feels fine, assessment WNL. Tylenol given per [facility] and advised to increase fluid intake. Will recheck.

## 2019-01-12 NOTE — Progress Notes (Signed)
PPD#1 Pt doing well. Lochia - mild. Unsure about circ. Has not paid.  VSSAF Hgb-10.8 IMP/ stable Plan/Routine care

## 2021-11-03 ENCOUNTER — Ambulatory Visit (HOSPITAL_COMMUNITY)
Admission: EM | Admit: 2021-11-03 | Discharge: 2021-11-03 | Disposition: A | Discharge status: Home / Self Care | Payer: Medicaid Other | Attending: Urgent Care | Admitting: Urgent Care

## 2021-11-03 ENCOUNTER — Encounter (HOSPITAL_COMMUNITY): Payer: Self-pay | Admitting: *Deleted

## 2021-11-03 ENCOUNTER — Other Ambulatory Visit: Payer: Self-pay

## 2021-11-03 DIAGNOSIS — M5020 Other cervical disc displacement, unspecified cervical region: Secondary | ICD-10-CM

## 2021-11-03 DIAGNOSIS — L02415 Cutaneous abscess of right lower limb: Secondary | ICD-10-CM

## 2021-11-03 MED ORDER — NAPROXEN 500 MG PO TABS: 500.0000 mg | ORAL_TABLET | Freq: Two times a day (BID) | ORAL | 0 refills | Status: DC

## 2021-11-03 MED ORDER — DOXYCYCLINE HYCLATE 100 MG PO CAPS: 100.0000 mg | ORAL_CAPSULE | Freq: Two times a day (BID) | ORAL | 0 refills | Status: DC

## 2021-11-03 MED ORDER — TIZANIDINE HCL 4 MG PO TABS: 4.0000 mg | ORAL_TABLET | Freq: Every day | ORAL | 0 refills | Status: DC

## 2021-11-03 MED ORDER — PREDNISONE 50 MG PO TABS: 50.0000 mg | ORAL_TABLET | Freq: Every day | ORAL | 0 refills | Status: DC
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
## 2021-11-03 NOTE — Discharge Instructions (Addendum)
Doxycycline is for the abscess. Use warm compresses to the area. For the neck we will be using an oral prednisone course for 5 days. After that you can use naproxen as needed for neck pains. Use tizanidine as a muscle relaxant at bedtime.
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
## 2021-11-13 ENCOUNTER — Encounter (HOSPITAL_COMMUNITY): Payer: Self-pay | Admitting: *Deleted

## 2021-11-13 ENCOUNTER — Ambulatory Visit (HOSPITAL_COMMUNITY)
Admission: EM | Admit: 2021-11-13 | Discharge: 2021-11-13 | Disposition: A | Discharge status: Home / Self Care | Payer: Medicaid Other | Attending: Internal Medicine | Admitting: Internal Medicine

## 2021-11-13 ENCOUNTER — Ambulatory Visit (INDEPENDENT_AMBULATORY_CARE_PROVIDER_SITE_OTHER): Payer: Medicaid Other

## 2021-11-13 ENCOUNTER — Other Ambulatory Visit: Payer: Self-pay

## 2021-11-13 DIAGNOSIS — S52522A Torus fracture of lower end of left radius, initial encounter for closed fracture: Secondary | ICD-10-CM

## 2021-11-13 DIAGNOSIS — M542 Cervicalgia: Secondary | ICD-10-CM | POA: Diagnosis not present

## 2021-11-13 DIAGNOSIS — W010XXA Fall on same level from slipping, tripping and stumbling without subsequent striking against object, initial encounter: Secondary | ICD-10-CM | POA: Diagnosis not present

## 2021-11-13 DIAGNOSIS — M79645 Pain in left finger(s): Secondary | ICD-10-CM | POA: Diagnosis not present

## 2021-11-13 MED ORDER — HYDROCODONE-ACETAMINOPHEN 5-325 MG PO TABS: 1.0000 | ORAL_TABLET | Freq: Once | ORAL | Status: DC

## 2021-11-13 MED ORDER — HYDROCODONE-ACETAMINOPHEN 5-325 MG PO TABS: 1.0000 | ORAL_TABLET | Freq: Four times a day (QID) | ORAL | 0 refills | Status: DC | PRN

## 2021-11-13 NOTE — ED Triage Notes (Signed)
Pt fell earlier today and now  has pain and swelling to LT wrist.

## 2021-11-13 NOTE — Discharge Instructions (Signed)
Elevate your arm above your heart when you are walking or resting Call your PCP so you can get a referral to orthopedics

## 2021-11-13 NOTE — Progress Notes (Signed)
Orthopedic Tech Progress Note Patient Details:  Crystal Wiggins Mar 08, 1981 [id number]  Ortho Devices Type of Ortho Device: Thumb spica splint Splint Material: Fiberglass Ortho Device/Splint Location: lue Ortho Device/Splint Interventions: Ordered, Application, Adjustment   Post Interventions Patient Tolerated: Well Instructions Provided: Care of device, Adjustment of device  Karolee Stamps 11/13/2021, 10:54 PM

## 2021-11-13 NOTE — ED Provider Notes (Addendum)
[place]    CSN: [id number] Arrival date & time: 11/13/21  1943      History   Chief Complaint Chief Complaint  Patient presents with   Fall   Wrist Pain    LT    HPI Crystal Wiggins is a 41 y.o. female who presents with L wrist pain after falling earlier today. She was wearing flip flops as she was taking the trash out and her flip flop came off from the L foot and she tried to not wall forward and landed on her L palm. Has severe pain on her L distal wrist and cant move her thumb since it causes worse pain.     Past Medical History:  Diagnosis Date   Medical history non-contributory     Patient Active Problem List   Diagnosis Date Noted   Advanced maternal age in multigravida, third trimester 01/11/2019   Cigarette smoker motivated to quit 11/23/2017    Past Surgical History:  Procedure Laterality Date   NO PAST SURGERIES      OB History     Gravida  4   Para  4   Term  3   Preterm  1   AB  0   Living  3      SAB  0   IAB  0   Ectopic  0   Multiple  0   Live Births  3            Home Medications    Prior to Admission medications   Medication Sig Start Date End Date Taking? Authorizing Provider  HYDROcodone-acetaminophen (NORCO/VICODIN) 5-325 MG tablet Take 1 tablet by mouth every 6 (six) hours as needed. 11/13/21  Yes Rodriguez-Southworth, Sunday Spillers, PA-C  naproxen (NAPROSYN) 500 MG tablet Take 1 tablet (500 mg total) by mouth 2 (two) times daily with a meal. 11/03/21   Jaynee Eagles, PA-C  predniSONE (DELTASONE) 50 MG tablet Take 1 tablet (50 mg total) by mouth daily with breakfast. 11/03/21   Jaynee Eagles, PA-C  prenatal vitamin w/FE, FA (PRENATAL 1 + 1) 27-1 MG TABS tablet Take 1 tablet by mouth daily at 12 noon.    [provider]  tiZANidine (ZANAFLEX) 4 MG tablet Take 1 tablet (4 mg total) by mouth at bedtime. 11/03/21   Jaynee Eagles, PA-C    Family History Family History  Problem Relation Age of Onset   Heart  disease Father    Cancer Maternal Grandmother    Diabetes Paternal Grandmother     Social History Social History   Tobacco Use   Smoking status: Former    Types: Cigarettes    Quit date: 06/12/2018    Years since quitting: 3.4   Smokeless tobacco: Never  Vaping Use   Vaping Use: Never used  Substance Use Topics   Alcohol use: No   Drug use: No     Allergies   Patient has no known allergies.   Review of Systems Review of Systems  Musculoskeletal:  Positive for arthralgias and joint swelling.  Skin:  Negative for color change, pallor, rash and wound.  Neurological:        Has numbness on wrist area, but not her fingers    Physical Exam Triage Vital Signs ED Triage Vitals  Enc Vitals Group     BP 11/13/21 1954 135/87     Pulse Rate 11/13/21 1954 77     Resp 11/13/21 1954 18     Temp 11/13/21 1954 99.3 F (  37.4 C)     Temp src --      SpO2 11/13/21 1954 96 %     Weight --      Height --      Head Circumference --      Peak Flow --      Pain Score 11/13/21 1952 8     Pain Loc --      Pain Edu? --      Excl. in [place]? --    No data found.  Updated Vital Signs BP 135/87    Pulse 77    Temp 99.3 F (37.4 C)    Resp 18    LMP 11/13/2021 (Approximate)    SpO2 96%   Visual Acuity Right Eye Distance:   Left Eye Distance:   Bilateral Distance:    Right Eye Near:   Left Eye Near:    Bilateral Near:     Physical Exam Vitals and nursing note reviewed.  HENT:     Head: Atraumatic.     Right Ear: External ear normal.     Left Ear: External ear normal.  Eyes:     General: No scleral icterus.    Conjunctiva/sclera: Conjunctivae normal.  Neck:     Comments: Her upper traps are tender and tender. Does not have cervical bone pain.  Pulmonary:     Effort: Pulmonary effort is normal.  Musculoskeletal:     Cervical back: Normal range of motion and neck supple.     Comments: L HAND- fingers are normal, palms is normal and non tender. She has swelling of distal  medial wrist and is very tender on distal radius. She has limited ROM of her thumb due to pain. Has mild snuff box tenderness.   BACK with no abrasions, ecchymosis or tenderness  Skin:    General: Skin is warm and dry.     Findings: No rash.  Neurological:     Mental Status: She is alert and oriented to person, place, and time.     Gait: Gait normal.  Psychiatric:        Mood and Affect: Mood normal.        Behavior: Behavior normal.        Thought Content: Thought content normal.        Judgment: Judgment normal.     UC Treatments / Results  Labs (all labs ordered are listed, but only abnormal results are displayed) Labs Reviewed - No data to display  EKG   Radiology DG Wrist Complete Left  Result Date: 11/13/2021 CLINICAL DATA:  Fall and trauma to the left wrist. EXAM: LEFT WRIST - COMPLETE 3+ VIEW COMPARISON:  None. FINDINGS: Nondisplaced, comminuted, intra-articular appearing fracture of the distal radius. No dislocation. There is soft tissue swelling of the wrist. IMPRESSION: Nondisplaced fracture of the distal radius. Electronically Signed   By: Anner Crete M.D.   On: 11/13/2021 20:03    Procedures Procedures (including critical care time)  Medications Ordered in UC Medications - No data to display   Initial Impression / Assessment and Plan / UC Course  I have reviewed the triage vital signs and the nursing notes. Pertinent  imaging results that were available during my care of the patient were reviewed by me and considered in my medical decision making (see chart for details). Has non displaced closed distal radial fracture Ulnar gutter with wrist Splint applied and advised to FU with ortho this week. She needs to call her PCP to have  her/him send a referral.  She was given Rx for Norco to fill as noted.     Final Clinical Impressions(s) / UC Diagnoses   Final diagnoses:  Traumatic closed nondisplaced torus fracture of distal radial metaphysis, left,  initial encounter     Discharge Instructions      Elevate your arm above your heart when you are walking or resting Call your PCP so you can get a referral to orthopedics     ED Prescriptions     Medication Sig Dispense Auth. Provider   HYDROcodone-acetaminophen (NORCO/VICODIN) 5-325 MG tablet Take 1 tablet by mouth every 6 (six) hours as needed. 10 tablet Rodriguez-Southworth, Sunday Spillers, PA-C      I have reviewed the PDMP during this encounter.   Rodriguez-Southworth, Sunday Spillers, PA-C 11/13/21 2020    Rodriguez-Southworth, Sunday Spillers, PA-C 11/13/21 2026    Rodriguez-Southworth, Sunday Spillers, PA-C 11/13/21 2034

## 2021-11-13 NOTE — ED Notes (Signed)
Ortho tech called 

## 2022-04-09 ENCOUNTER — Ambulatory Visit (HOSPITAL_COMMUNITY)
Admission: RE | Admit: 2022-04-09 | Discharge: 2022-04-09 | Disposition: A | Discharge status: Home / Self Care | Payer: Medicaid Other | Source: Ambulatory Visit | Attending: Internal Medicine | Admitting: Internal Medicine

## 2022-04-09 ENCOUNTER — Encounter (HOSPITAL_COMMUNITY): Payer: Self-pay

## 2022-04-09 ENCOUNTER — Other Ambulatory Visit: Payer: Self-pay

## 2022-04-09 VITALS — BP 138/93 | HR 80 | Temp 98.6°F | Resp 20

## 2022-04-09 DIAGNOSIS — L02412 Cutaneous abscess of left axilla: Secondary | ICD-10-CM

## 2022-04-09 DIAGNOSIS — L02411 Cutaneous abscess of right axilla: Secondary | ICD-10-CM | POA: Diagnosis not present

## 2022-04-09 MED ORDER — LIDOCAINE-EPINEPHRINE 1 %-1:100000 IJ SOLN
INTRAMUSCULAR | Status: AC
  Filled 2022-04-09: qty 1

## 2022-04-09 MED ORDER — IBUPROFEN 600 MG PO TABS: 600.0000 mg | ORAL_TABLET | Freq: Four times a day (QID) | ORAL | 0 refills | Status: AC | PRN

## 2022-04-09 MED ORDER — DOXYCYCLINE HYCLATE 100 MG PO CAPS
100.0000 mg | ORAL_CAPSULE | Freq: Two times a day (BID) | ORAL | 0 refills | Status: AC
Start: 2022-04-09 — End: ?

## 2022-04-09 NOTE — ED Provider Notes (Signed)
[place]    CSN: [id number] Arrival date & time: 04/09/22  1115      History   Chief Complaint Chief Complaint  Patient presents with   Appointment    11:30   Abscess    HPI Crystal Wiggins is a 41 y.o. female.   Patient presents to urgent care for evaluation of her right axilla abscess that first showed up 3 days ago and has increased in size and tenderness over that time.  She states that she shaved her right armpit and used Carlton Adam hair removal cream 5 days ago.  She has had right axillary abscesses in the past most recently 8 months ago in various locations to her right armpit.  Abscess today is to the lateral outer upper quadrant of the left armpit.  Abscess is not draining, but is very tender to touch and patient currently rates pain at an 8 on a scale of 0-10.  She denies allergies to medications and antibiotics.  She has not tried any over-the-counter medications or nonpharmacologic ways to decrease the swelling.  She denies fever/chills and numbness and tingling to the right upper extremity.  No other aggravating or relieving factors identified at this time.  She is otherwise asymptomatic.   Abscess   Past Medical History:  Diagnosis Date   Medical history non-contributory     Patient Active Problem List   Diagnosis Date Noted   Advanced maternal age in multigravida, third trimester 01/11/2019   Cigarette smoker motivated to quit 11/23/2017    Past Surgical History:  Procedure Laterality Date   NO PAST SURGERIES      OB History     Gravida  4   Para  4   Term  3   Preterm  1   AB  0   Living  3      SAB  0   IAB  0   Ectopic  0   Multiple  0   Live Births  3            Home Medications    Prior to Admission medications   Medication Sig Start Date End Date Taking? Authorizing Provider  doxycycline (VIBRAMYCIN) 100 MG capsule Take 1 capsule (100 mg total) by mouth 2 (two) times daily. 04/09/22  Yes Talbot Grumbling, FNP   ibuprofen (ADVIL) 600 MG tablet Take 1 tablet (600 mg total) by mouth every 6 (six) hours as needed. 04/09/22  Yes Naylani Bradner, Stasia Cavalier, FNP    Family History Family History  Problem Relation Age of Onset   Heart disease Father    Cancer Maternal Grandmother    Diabetes Paternal Grandmother     Social History Social History   Tobacco Use   Smoking status: Every Day    Types: Cigarettes   Smokeless tobacco: Never  Vaping Use   Vaping Use: Never used  Substance Use Topics   Alcohol use: Yes   Drug use: No     Allergies   Patient has no known allergies.   Review of Systems Review of Systems Per HPI  Physical Exam Triage Vital Signs ED Triage Vitals  Enc Vitals Group     BP 04/09/22 1146 (!) 138/93     Pulse Rate 04/09/22 1146 80     Resp 04/09/22 1146 20     Temp 04/09/22 1146 98.6 F (37 C)     Temp Source 04/09/22 1146 Oral     SpO2 04/09/22 1146 98 %  Weight --      Height --      Head Circumference --      Peak Flow --      Pain Score 04/09/22 1144 8     Pain Loc --      Pain Edu? --      Excl. in [place]? --    No data found.  Updated Vital Signs BP (!) 138/93 (BP Location: Left Arm)   Pulse 80   Temp 98.6 F (37 C) (Oral)   Resp 20   LMP 03/19/2022   SpO2 98%   Visual Acuity Right Eye Distance:   Left Eye Distance:   Bilateral Distance:    Right Eye Near:   Left Eye Near:    Bilateral Near:     Physical Exam Vitals and nursing note reviewed.  Constitutional:      Appearance: Normal appearance. She is not ill-appearing or toxic-appearing.     Comments: Very pleasant patient sitting on exam in position of comfort table in no acute distress.   HENT:     Head: Normocephalic and atraumatic.     Right Ear: Hearing and external ear normal.     Left Ear: Hearing and external ear normal.     Nose: Nose normal.     Mouth/Throat:     Lips: Pink.     Mouth: Mucous membranes are moist.  Eyes:     General: Lids are normal. Vision grossly  intact. Gaze aligned appropriately.     Extraocular Movements: Extraocular movements intact.     Conjunctiva/sclera: Conjunctivae normal.  Pulmonary:     Effort: Pulmonary effort is normal.  Abdominal:     Palpations: Abdomen is soft.  Musculoskeletal:     Cervical back: Neck supple.  Skin:    General: Skin is warm and dry.     Capillary Refill: Capillary refill takes less than 2 seconds.     Findings: Abscess present. No rash.     Comments: 3 cm x 2 cm abscess to patient's right axilla in the lateral left outer quadrant.  No drainage noted.  Abscess appears red and warm to the touch.  Area of fluctuance palpated.  Skin surrounding abscess appears normal in color and temperature.  5/5 grip strength to bilateral upper extremities.  5/5 strength against resistance of bilateral upper extremities with abduction and abduction.  Normal sensation.  Neurovascularly intact.  +2 right radial pulse.  Neurological:     General: No focal deficit present.     Mental Status: She is alert and oriented to person, place, and time. Mental status is at baseline.     Cranial Nerves: No dysarthria or facial asymmetry.     Gait: Gait is intact.  Psychiatric:        Mood and Affect: Mood normal.        Speech: Speech normal.        Behavior: Behavior normal.        Thought Content: Thought content normal.        Judgment: Judgment normal.      UC Treatments / Results  Labs (all labs ordered are listed, but only abnormal results are displayed) Labs Reviewed - No data to display  EKG   Radiology No results found.  Procedures Incision and Drainage  Date/Time: 04/09/2022 1:24 PM  Performed by: Talbot Grumbling, FNP Authorized by: Talbot Grumbling, FNP   Consent:    Consent obtained:  Verbal   Consent given by:  Patient   Risks, benefits, and alternatives were discussed: yes     Risks discussed:  Bleeding, incomplete drainage, pain, infection and damage to other organs   Alternatives  discussed:  No treatment Universal protocol:    Procedure explained and questions answered to patient or proxy's satisfaction: yes     Patient identity confirmed:  Verbally with patient Location:    Type:  Abscess   Size:  3cm x 2cm   Location:  Upper extremity   Upper extremity location:  Arm   Arm location:  R upper arm (Right axilla) Pre-procedure details:    Skin preparation:  Povidone-iodine Sedation:    Sedation type:  None Anesthesia:    Anesthesia method:  Local infiltration   Local anesthetic:  Lidocaine 1% WITH epi Procedure type:    Complexity:  Simple Procedure details:    Ultrasound guidance: no     Incision types:  Stab incision   Incision depth:  Dermal   Wound management:  Irrigated with saline   Drainage:  Bloody and purulent   Drainage amount:  Moderate   Wound treatment:  Wound left open   Packing materials:  None Post-procedure details:    Procedure completion:  Tolerated well, no immediate complications  (including critical care time)  Medications Ordered in UC Medications - No data to display  Initial Impression / Assessment and Plan / UC Course  I have reviewed the triage vital signs and the nursing notes.  Pertinent labs & imaging results that were available during my care of the patient were reviewed by me and considered in my medical decision making (see chart for details).  Abscess to the RIGHT axilla See procedure note above for further detail.  Wound left open to continue to drain.  Wound cleansed and dressed in clinic prior to patient's discharge.  Change dressings 2 times a day.  Warm compresses to encourage infected material drainage from wound.  Doxycycline twice daily for the next 10 days prescribed.  Patient to return for new or worsening signs of infection/fever.  Ibuprofen 600 mg every 6 hours as needed for pain prescribed.  No ointments, lotions, powders, or deodorant to the right armpit until the wound heals fully.  Work note  given.  Counseled patient regarding appropriate use of medications and potential side effects for all medications recommended or prescribed today. Discussed red flag signs and symptoms of worsening condition,when to call the PCP office, return to urgent care, and when to seek higher level of care. Patient verbalizes understanding and agreement with plan. All questions answered. Patient discharged in stable condition.  Final Clinical Impressions(s) / UC Diagnoses   Final diagnoses:  Abscess of axilla, left     Discharge Instructions      Take doxycycline antibiotic twice daily for the next 10 days for infected abscess.  You may take 600 mg of ibuprofen every 6 hours as needed for pain and inflammation.  Take this medication with food to avoid GI upset.  Change wound dressing twice daily and frequently use warm compresses to decrease swelling and express infected material from the wound.  Wound will heal over time.  Do not apply ointments, lotions, powders, or use deodorant to your armpits until wound heals fully.  If you notice increase signs of infection such as increased redness, swelling, or significant amount of white drainage from wound after initial improvement, please return to urgent care for further evaluation.  If you develop any new or worsening symptoms or do not improve  in the next 2 to 3 days, please return.  If your symptoms are severe, please go to the emergency room.  Follow-up with your primary care provider for further evaluation and management of your symptoms as well as ongoing wellness visits.  I hope you feel better!     ED Prescriptions     Medication Sig Dispense Auth. Provider   ibuprofen (ADVIL) 600 MG tablet Take 1 tablet (600 mg total) by mouth every 6 (six) hours as needed. 30 tablet Talbot Grumbling, FNP   doxycycline (VIBRAMYCIN) 100 MG capsule Take 1 capsule (100 mg total) by mouth 2 (two) times daily. 20 capsule Talbot Grumbling, FNP       PDMP not reviewed this encounter.   Talbot Grumbling, [place] 04/09/22 1327

## 2022-04-09 NOTE — Discharge Instructions (Addendum)
Take doxycycline antibiotic twice daily for the next 10 days for infected abscess.  You may take 600 mg of ibuprofen every 6 hours as needed for pain and inflammation.  Take this medication with food to avoid GI upset.  Change wound dressing twice daily and frequently use warm compresses to decrease swelling and express infected material from the wound.  Wound will heal over time.  Do not apply ointments, lotions, powders, or use deodorant to your armpits until wound heals fully.  If you notice increase signs of infection such as increased redness, swelling, or significant amount of white drainage from wound after initial improvement, please return to urgent care for further evaluation.  If you develop any new or worsening symptoms or do not improve in the next 2 to 3 days, please return.  If your symptoms are severe, please go to the emergency room.  Follow-up with your primary care provider for further evaluation and management of your symptoms as well as ongoing wellness visits.  I hope you feel better!

## 2022-04-09 NOTE — ED Triage Notes (Signed)
Reports abscess under right arm  patient reports shaving right axilla and reports this increasing in size and pain.  Patient has a history of abscess in right axilla

## 2022-05-03 IMAGING — DX DG WRIST COMPLETE 3+V*L*
3 series · 3 of 3 positions shown · non-contrast
Comparison: None.

CLINICAL DATA: Fall and trauma to the left wrist.

EXAM:
LEFT WRIST - COMPLETE 3+ VIEW

[wrist pa]
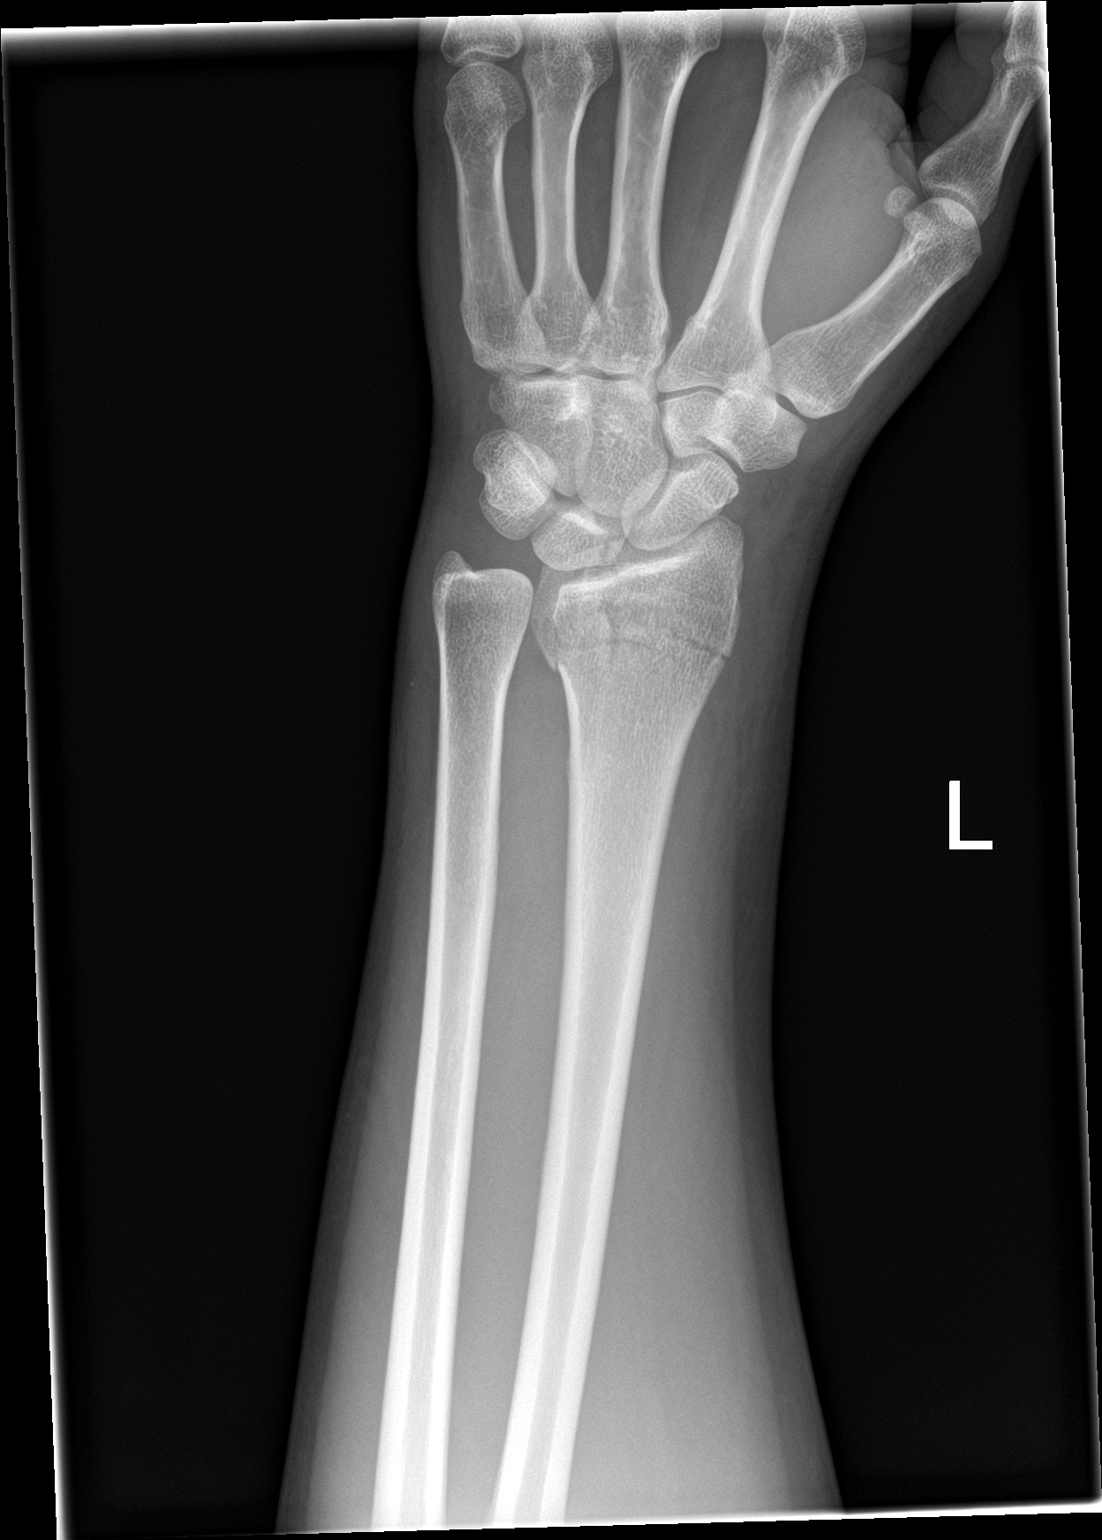

[wrist obl]
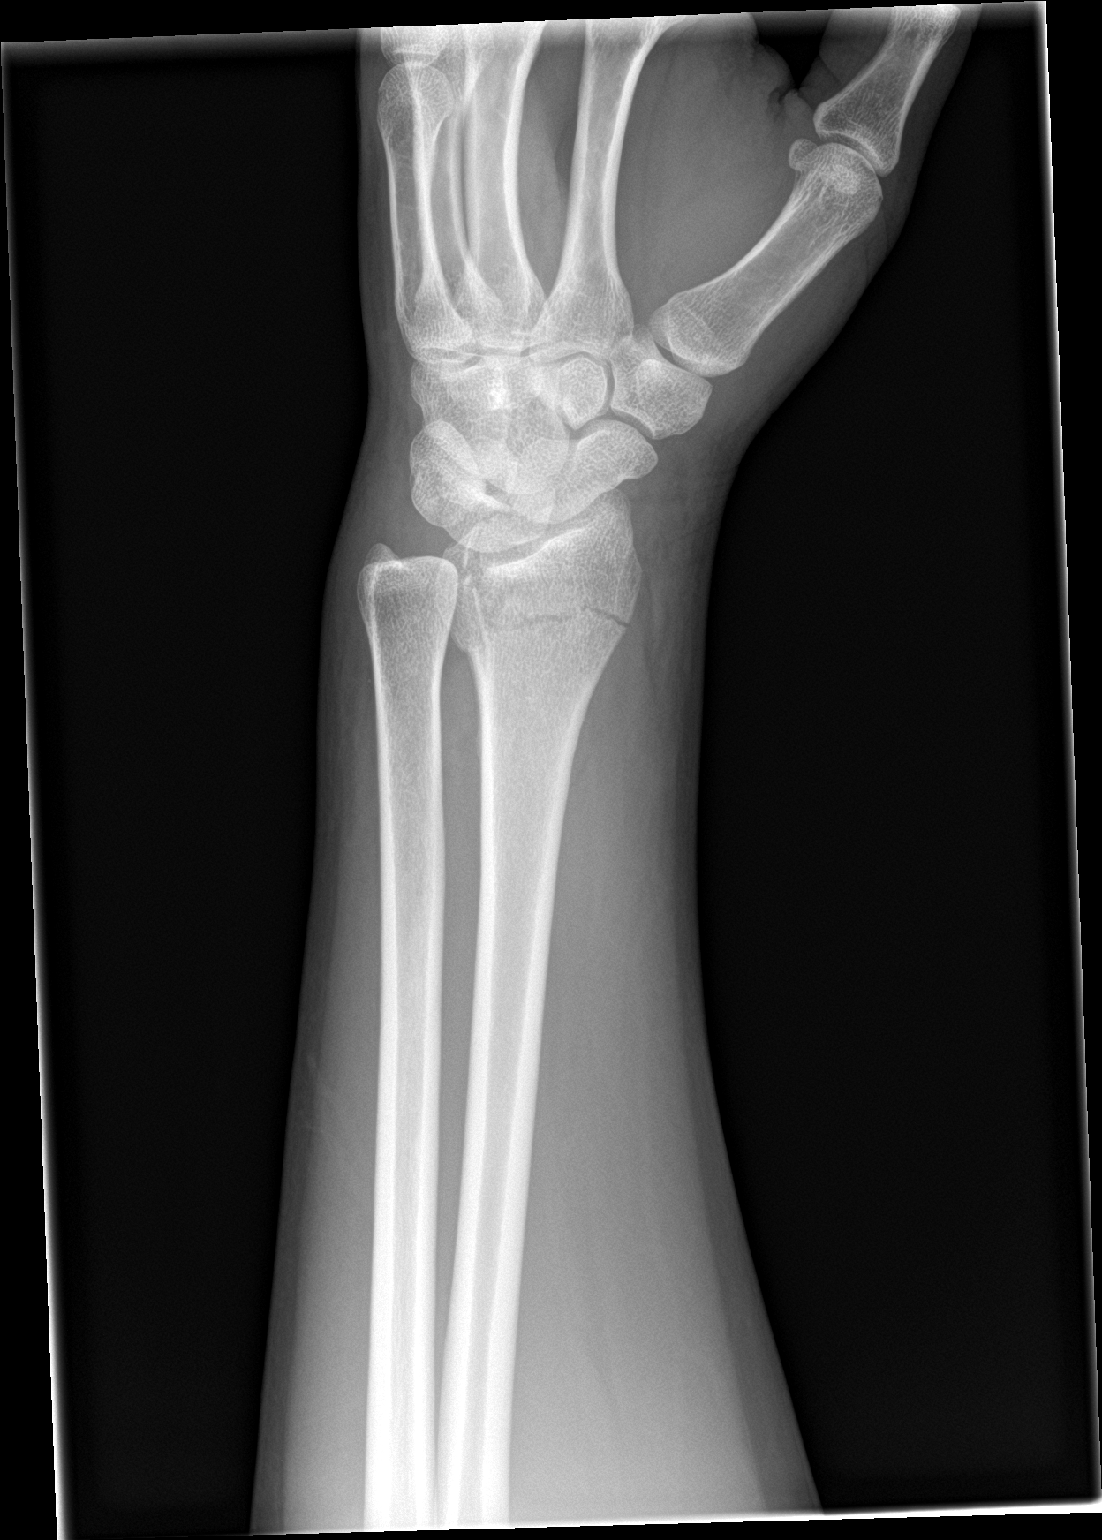

[wrist lat]
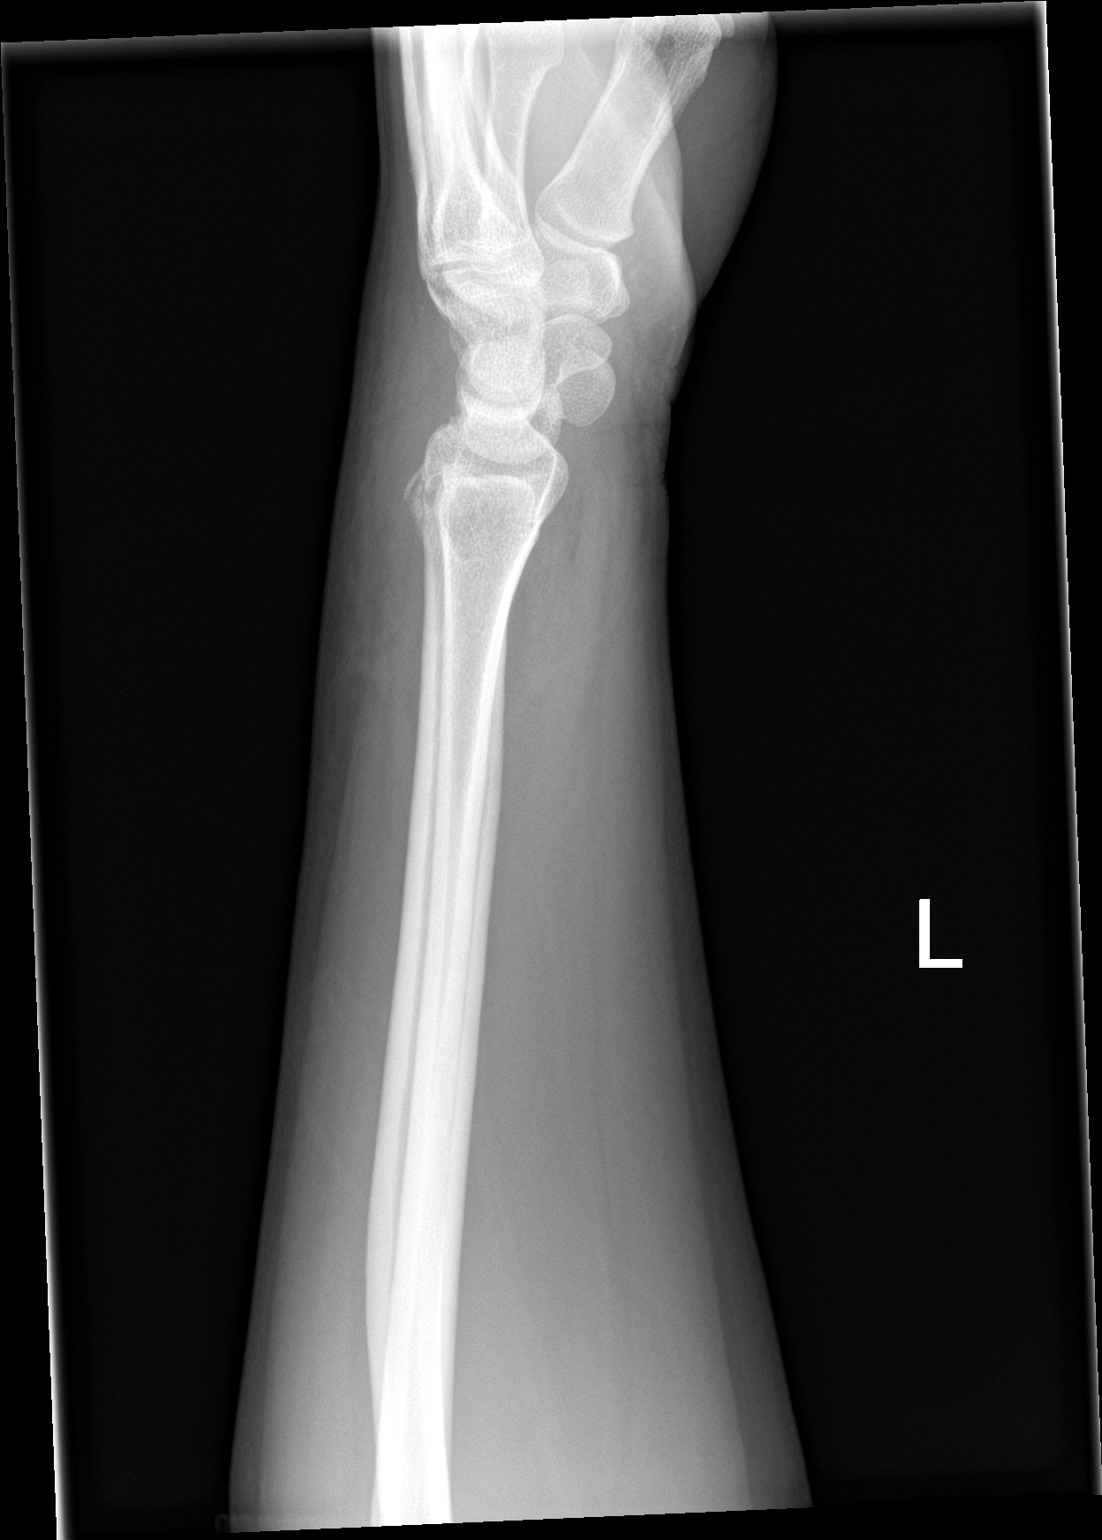

[3 of 3 positions shown; findings below may reference images not displayed]

FINDINGS: Nondisplaced, comminuted, intra-articular appearing fracture of the
distal radius. No dislocation. There is soft tissue swelling of the
wrist.
IMPRESSION: Nondisplaced fracture of the distal radius.

## 2022-08-10 ENCOUNTER — Ambulatory Visit (HOSPITAL_COMMUNITY)
Admission: EM | Admit: 2022-08-10 | Discharge: 2022-08-10 | Disposition: A | Discharge status: Home / Self Care | Payer: Medicaid Other | Attending: Internal Medicine | Admitting: Internal Medicine

## 2022-08-10 ENCOUNTER — Encounter (HOSPITAL_COMMUNITY): Payer: Self-pay | Admitting: Emergency Medicine

## 2022-08-10 DIAGNOSIS — R350 Frequency of micturition: Secondary | ICD-10-CM | POA: Diagnosis present

## 2022-08-10 DIAGNOSIS — N3001 Acute cystitis with hematuria: Secondary | ICD-10-CM | POA: Insufficient documentation

## 2022-08-10 DIAGNOSIS — R102 Pelvic and perineal pain: Secondary | ICD-10-CM | POA: Diagnosis present

## 2022-08-10 LAB — POCT URINALYSIS DIPSTICK, ED / UC
Bilirubin Urine: NEGATIVE
Glucose, UA: NEGATIVE mg/dL
Ketones, ur: NEGATIVE mg/dL
Nitrite: NEGATIVE
Protein, ur: >=300 mg/dL — AB
Specific Gravity, Urine: >=1.03 (ref 1.005–1.030)
Urobilinogen, UA: 0.2 mg/dL (ref 0.0–1.0)
pH: 7 (ref 5.0–8.0)

## 2022-08-10 MED ORDER — CEPHALEXIN 500 MG PO CAPS: 500.0000 mg | ORAL_CAPSULE | Freq: Two times a day (BID) | ORAL | 0 refills | Status: AC

## 2022-08-10 NOTE — Discharge Instructions (Addendum)
You have been diagnosed with urinary tract infection today.  I have prescribed Keflex for you to take 500 mg twice daily for the next 7 days.  If you develop diarrhea while taking this medication you may purchase an over-the-counter probiotic or eat yogurt with live active cultures.  To avoid GI upset please take this medication with food. I have sent your urine for culture to see what type of bacteria grows. We will call you if we need to change the treatment plan based on the results of your urine culture.  If you develop any new or worsening symptoms or do not improve in the next 2 to 3 days, please return.  If your symptoms are severe, please go to the emergency room.  Follow-up with your primary care provider for further evaluation and management of your symptoms as well as ongoing wellness visits.  I hope you feel better! 

## 2022-08-10 NOTE — ED Provider Notes (Signed)
[place]    CSN: [id number] Arrival date & time: 08/10/22  1918      History   Chief Complaint Chief Complaint  Patient presents with   Dysuria    HPI Crystal Wiggins is a 41 y.o. female.   Patient presents urgent care for evaluation of bladder pressure and urinary frequency that started a couple of days ago after she drank alcohol heavily.  She is experiencing suprapubic abdominal pressure and discomfort with urination.  Denies dysuria, nausea, vomiting, dizziness, flank pain, new low back pain, fever/chills, URI symptoms, and vaginal symptoms.  No urinary urgency, but she is experiencing frequency.  She admits to drinking a lot of soda and juice with minimal water intake.  Denies frequent urinary tract infections.  She is not allergic to any antibiotics.  She did state that she saw some blood in her urine she was providing her urine sample urgent care.  Last menstrual cycle was 1 week ago.    Dysuria   Past Medical History:  Diagnosis Date   Medical history non-contributory     Patient Active Problem List   Diagnosis Date Noted   Advanced maternal age in multigravida, third trimester 01/11/2019   Cigarette smoker motivated to quit 11/23/2017    Past Surgical History:  Procedure Laterality Date   NO PAST SURGERIES      OB History     Gravida  4   Para  4   Term  3   Preterm  1   AB  0   Living  3      SAB  0   IAB  0   Ectopic  0   Multiple  0   Live Births  3            Home Medications    Prior to Admission medications   Medication Sig Start Date End Date Taking? Authorizing Provider  cephALEXin (KEFLEX) 500 MG capsule Take 1 capsule (500 mg total) by mouth 2 (two) times daily for 7 days. 08/10/22 08/17/22 Yes Makiah Foye, Stasia Cavalier, FNP  doxycycline (VIBRAMYCIN) 100 MG capsule Take 1 capsule (100 mg total) by mouth 2 (two) times daily. 04/09/22   Talbot Grumbling, FNP  ibuprofen (ADVIL) 600 MG tablet Take 1 tablet (600  mg total) by mouth every 6 (six) hours as needed. 04/09/22   Talbot Grumbling, FNP    Family History Family History  Problem Relation Age of Onset   Heart disease Father    Cancer Maternal Grandmother    Diabetes Paternal Grandmother     Social History Social History   Tobacco Use   Smoking status: Every Day    Types: Cigarettes   Smokeless tobacco: Never  Vaping Use   Vaping Use: Never used  Substance Use Topics   Alcohol use: Yes   Drug use: No     Allergies   Patient has no known allergies.   Review of Systems Review of Systems  Genitourinary:  Positive for dysuria.  Per HPI   Physical Exam Triage Vital Signs ED Triage Vitals  Enc Vitals Group     BP 08/10/22 1928 (!) 145/90     Pulse Rate 08/10/22 1928 90     Resp 08/10/22 1928 17     Temp 08/10/22 1928 98.8 F (37.1 C)     Temp Source 08/10/22 1928 Oral     SpO2 08/10/22 1928 97 %     Weight --      Height --  Head Circumference --      Peak Flow --      Pain Score 08/10/22 1926 6     Pain Loc --      Pain Edu? --      Excl. in [place]? --    No data found.  Updated Vital Signs BP (!) 145/90 (BP Location: Right Arm)   Pulse 90   Temp 98.8 F (37.1 C) (Oral)   Resp 17   LMP 08/03/2022   SpO2 97%   Visual Acuity Right Eye Distance:   Left Eye Distance:   Bilateral Distance:    Right Eye Near:   Left Eye Near:    Bilateral Near:     Physical Exam Vitals and nursing note reviewed.  Constitutional:      Appearance: She is not ill-appearing or toxic-appearing.  HENT:     Head: Normocephalic and atraumatic.     Right Ear: Hearing and external ear normal.     Left Ear: Hearing and external ear normal.     Nose: Nose normal.     Mouth/Throat:     Lips: Pink.  Eyes:     General: Lids are normal. Vision grossly intact. Gaze aligned appropriately.     Extraocular Movements: Extraocular movements intact.     Conjunctiva/sclera: Conjunctivae normal.  Pulmonary:     Effort:  Pulmonary effort is normal.  Abdominal:     General: Bowel sounds are normal.     Palpations: Abdomen is soft.     Tenderness: There is no abdominal tenderness. There is no right CVA tenderness, left CVA tenderness or guarding.  Musculoskeletal:     Cervical back: Neck supple.  Skin:    General: Skin is warm and dry.     Capillary Refill: Capillary refill takes less than 2 seconds.     Findings: No rash.  Neurological:     General: No focal deficit present.     Mental Status: She is alert and oriented to person, place, and time. Mental status is at baseline.     Cranial Nerves: No dysarthria or facial asymmetry.  Psychiatric:        Mood and Affect: Mood normal.        Speech: Speech normal.        Behavior: Behavior normal.        Thought Content: Thought content normal.        Judgment: Judgment normal.      UC Treatments / Results  Labs (all labs ordered are listed, but only abnormal results are displayed) Labs Reviewed  POCT URINALYSIS DIPSTICK, ED / UC - Abnormal; Notable for the following components:      Result Value   Hgb urine dipstick LARGE (*)    Protein, ur >=300 (*)    Leukocytes,Ua MODERATE (*)    All other components within normal limits  URINE CULTURE    EKG   Radiology No results found.  Procedures Procedures (including critical care time)  Medications Ordered in UC Medications - No data to display  Initial Impression / Assessment and Plan / UC Course  I have reviewed the triage vital signs and the nursing notes.  Pertinent labs & imaging results that were available during my care of the patient were reviewed by me and considered in my medical decision making (see chart for details).   1.  Acute cystitis with hematuria Urinalysis confirms presence of leukocytes, hemoglobin, and protein consistent with acute uncomplicated cystitis.  Patient is not allergic  to any antibiotics so we will treat with Keflex twice daily for the next 7 days.  Advised  patient to take this medicine with food to avoid stomach upset.  Advised use of Tylenol as needed for suprapubic abdominal pain.  Recommend increase water intake to at least 64 ounces of water per day and decreased intake of urinary irritants.  Urine culture is pending and we will change antibiotic based on urine culture.  Discussed physical exam and available lab work findings in clinic with patient.  Counseled patient regarding appropriate use of medications and potential side effects for all medications recommended or prescribed today. Discussed red flag signs and symptoms of worsening condition,when to call the PCP office, return to urgent care, and when to seek higher level of care in the emergency department. Patient verbalizes understanding and agreement with plan. All questions answered. Patient discharged in stable condition.   Final Clinical Impressions(s) / UC Diagnoses   Final diagnoses:  Acute cystitis with hematuria  Urinary frequency  Suprapubic abdominal pain     Discharge Instructions      You have been diagnosed with urinary tract infection today.  I have prescribed Keflex for you to take 500 mg twice daily for the next 7 days.  If you develop diarrhea while taking this medication you may purchase an over-the-counter probiotic or eat yogurt with live active cultures.  To avoid GI upset please take this medication with food. I have sent your urine for culture to see what type of bacteria grows. We will call you if we need to change the treatment plan based on the results of your urine culture.  If you develop any new or worsening symptoms or do not improve in the next 2 to 3 days, please return.  If your symptoms are severe, please go to the emergency room.  Follow-up with your primary care provider for further evaluation and management of your symptoms as well as ongoing wellness visits.  I hope you feel better!   ED Prescriptions     Medication Sig Dispense Auth. Provider    cephALEXin (KEFLEX) 500 MG capsule Take 1 capsule (500 mg total) by mouth 2 (two) times daily for 7 days. 14 capsule Talbot Grumbling, FNP      PDMP not reviewed this encounter.   Talbot Grumbling, [place] 08/10/22 2017

## 2022-08-10 NOTE — ED Triage Notes (Signed)
Pt reports was out drinking with friends on Wed and was holding her urine due to not wanting to get up. Reports having urges to urinate and not much coming out and dysuria. Tried cranberry juice

## 2022-08-12 LAB — URINE CULTURE

## 2023-07-19 ENCOUNTER — Other Ambulatory Visit (HOSPITAL_COMMUNITY): Payer: Self-pay

## 2023-07-22 ENCOUNTER — Other Ambulatory Visit (HOSPITAL_COMMUNITY): Payer: Self-pay

## 2023-07-22 MED ORDER — OXYCODONE-ACETAMINOPHEN 5-325 MG PO TABS
1.0000 | ORAL_TABLET | Freq: Two times a day (BID) | ORAL | 0 refills | Status: AC | PRN
Start: 2023-07-21 — End: ?
  Filled 2023-07-22: qty 60, 30d supply, fill #0

## 2023-07-23 ENCOUNTER — Other Ambulatory Visit (HOSPITAL_COMMUNITY): Payer: Self-pay

## 2023-08-19 ENCOUNTER — Other Ambulatory Visit: Payer: Self-pay

## 2023-08-19 ENCOUNTER — Other Ambulatory Visit (HOSPITAL_COMMUNITY): Payer: Self-pay

## 2023-08-19 MED ORDER — LISINOPRIL 10 MG PO TABS
10.0000 mg | ORAL_TABLET | Freq: Every day | ORAL | 1 refills | Status: AC
  Filled 2023-08-19: qty 90, 90d supply, fill #0

## 2023-08-19 MED ORDER — OXYCODONE-ACETAMINOPHEN 5-325 MG PO TABS
1.0000 | ORAL_TABLET | Freq: Two times a day (BID) | ORAL | 0 refills | Status: AC | PRN
Start: 2023-08-17 — End: ?
  Filled 2023-08-19: qty 60, 30d supply, fill #0

## 2023-08-19 MED ORDER — ERGOCALCIFEROL 1.25 MG (50000 UT) PO CAPS
50000.0000 [IU] | ORAL_CAPSULE | ORAL | 5 refills | Status: AC
  Filled 2023-08-19: qty 4, 28d supply, fill #0

## 2023-09-04 ENCOUNTER — Ambulatory Visit: Payer: Medicaid Other | Attending: Neurosurgery

## 2023-09-11 ENCOUNTER — Other Ambulatory Visit (HOSPITAL_COMMUNITY): Payer: Self-pay

## 2023-09-11 MED ORDER — OXYCODONE-ACETAMINOPHEN 5-325 MG PO TABS
1.0000 | ORAL_TABLET | Freq: Two times a day (BID) | ORAL | 0 refills | Status: DC | PRN
Start: 2023-09-19 — End: 2023-10-18
  Filled 2023-09-23: qty 60, 30d supply, fill #0

## 2023-09-17 ENCOUNTER — Other Ambulatory Visit (HOSPITAL_COMMUNITY): Payer: Self-pay

## 2023-09-23 ENCOUNTER — Other Ambulatory Visit (HOSPITAL_COMMUNITY): Payer: Self-pay

## 2023-10-18 ENCOUNTER — Other Ambulatory Visit (HOSPITAL_COMMUNITY): Payer: Self-pay

## 2023-10-18 MED ORDER — OXYCODONE-ACETAMINOPHEN 5-325 MG PO TABS
1.0000 | ORAL_TABLET | Freq: Two times a day (BID) | ORAL | 0 refills | Status: DC | PRN
Start: 2023-10-18 — End: 2023-11-19
  Filled 2023-10-18 – 2023-10-21 (×2): qty 60, 30d supply, fill #0

## 2023-10-18 MED ORDER — OMEPRAZOLE 10 MG PO CPDR
10.0000 mg | DELAYED_RELEASE_CAPSULE | Freq: Every day | ORAL | 0 refills | Status: AC
  Filled 2023-10-18 (×2): qty 90, 90d supply, fill #0

## 2023-10-18 MED ORDER — LISINOPRIL-HYDROCHLOROTHIAZIDE 20-25 MG PO TABS
1.0000 | ORAL_TABLET | Freq: Every day | ORAL | 0 refills | Status: AC
  Filled 2023-10-18: qty 90, 90d supply, fill #0

## 2023-10-21 ENCOUNTER — Other Ambulatory Visit (HOSPITAL_COMMUNITY): Payer: Self-pay

## 2023-10-31 ENCOUNTER — Other Ambulatory Visit (HOSPITAL_COMMUNITY): Payer: Self-pay

## 2023-11-19 ENCOUNTER — Other Ambulatory Visit (HOSPITAL_COMMUNITY): Payer: Self-pay

## 2023-11-19 MED ORDER — OXYCODONE-ACETAMINOPHEN 5-325 MG PO TABS
1.0000 | ORAL_TABLET | Freq: Two times a day (BID) | ORAL | 0 refills | Status: AC | PRN
Start: 2023-11-19 — End: ?
  Filled 2023-11-19: qty 60, 30d supply, fill #0
